# Patient Record
Sex: Male | Born: 1955 | Race: White | Hispanic: No | Marital: Married | Smoking: Former smoker
Health system: Southern US, Community
[De-identification: ages and names within clinical notes are randomized; demographics above are authoritative.]

---

## 1988-07-30 HISTORY — PX: KNEE SURGERY: SHX244

## 2003-07-31 HISTORY — PX: FINGER SURGERY: SHX640

## 2015-03-09 DIAGNOSIS — H532 Diplopia: Secondary | ICD-10-CM | POA: Insufficient documentation

## 2015-03-09 DIAGNOSIS — E669 Obesity, unspecified: Secondary | ICD-10-CM | POA: Insufficient documentation

## 2015-05-29 ENCOUNTER — Other Ambulatory Visit: Payer: Self-pay | Admitting: Family Medicine

## 2015-08-25 ENCOUNTER — Other Ambulatory Visit: Payer: Self-pay | Admitting: Family Medicine

## 2015-08-25 DIAGNOSIS — I10 Essential (primary) hypertension: Secondary | ICD-10-CM

## 2015-08-25 DIAGNOSIS — E7800 Pure hypercholesterolemia, unspecified: Secondary | ICD-10-CM

## 2015-08-25 NOTE — Telephone Encounter (Signed)
Last ov 10/11/14

## 2015-08-25 NOTE — Telephone Encounter (Signed)
Refilled medication. Remind patient he is due for physical and recheck of labs.

## 2015-09-30 ENCOUNTER — Encounter: Payer: Self-pay | Admitting: Family Medicine

## 2015-09-30 ENCOUNTER — Ambulatory Visit (INDEPENDENT_AMBULATORY_CARE_PROVIDER_SITE_OTHER): Payer: BLUE CROSS/BLUE SHIELD | Admitting: Family Medicine

## 2015-09-30 VITALS — BP 138/80 | HR 76 | Temp 97.6°F | Resp 16 | Wt 220.0 lb

## 2015-09-30 DIAGNOSIS — E7800 Pure hypercholesterolemia, unspecified: Secondary | ICD-10-CM

## 2015-09-30 DIAGNOSIS — I10 Essential (primary) hypertension: Secondary | ICD-10-CM

## 2015-09-30 MED ORDER — QUINAPRIL-HYDROCHLOROTHIAZIDE 20-12.5 MG PO TABS: 1.0000 | ORAL_TABLET | Freq: Every day | ORAL | Status: DC

## 2015-09-30 MED ORDER — AMLODIPINE BESYLATE 5 MG PO TABS: 5.0000 mg | ORAL_TABLET | Freq: Every day | ORAL | Status: DC

## 2015-09-30 MED ORDER — PRAVASTATIN SODIUM 40 MG PO TABS: 40.0000 mg | ORAL_TABLET | Freq: Every day | ORAL | Status: DC

## 2015-09-30 NOTE — Progress Notes (Signed)
Patient ID: Bradley Chambers, male   DOB: [birth year], 60 y.o.   MRN: [id number]       Patient: Bradley Chambers Male    DOB: 1956-02-15   60 y.o.   MRN: [id number] Visit Date: 09/30/2015  Today's Provider: Dortha Kern, PA   Chief Complaint  Patient presents with  . Hypertension  . Hyperlipidemia   Subjective:    HPI  Hypertension, follow-up:  BP Readings from Last 3 Encounters:  09/30/15 138/80  10/11/14 158/76    He was last seen for hypertension 1 years ago.  BP at that visit was 158/76. Management since that visit includes none. He reports fair compliance with treatment. He is not having side effects.  He is not exercising. He is adherent to low salt diet.   Outside blood pressures are not being checked. He is experiencing none.  Patient denies chest pain, chest pressure/discomfort, claudication, dyspnea, exertional chest pressure/discomfort, fatigue, irregular heart beat, lower extremity edema, near-syncope, orthopnea and palpitations.   Cardiovascular risk factors include advanced age (older than 56 for men, 71 for women), dyslipidemia, hypertension, male gender, obesity (BMI >= 30 kg/m2) and smoking/ tobacco exposure.  Wt Readings from Last 3 Encounters:  09/30/15 220 lb (99.791 kg)  10/11/14 212 lb (96.163 kg)   ------------------------------------------------------------------------    Lipid/Cholesterol, Follow-up:   Last seen for this1 years ago.  Management changes since that visit include none. . Last Lipid Panel: No results found for: CHOL, TRIG, HDL, CHOLHDL, VLDL, LDLCALC, LDLDIRECT  Risk factors for vascular disease include hypercholesterolemia and hypertension  He reports good compliance with treatment. He is not having side effects.  Current symptoms include none. Current exercise: none  Wt Readings from Last 3 Encounters:  09/30/15 220 lb (99.791 kg)  10/11/14 212 lb (96.163 kg)     -------------------------------------------------------------------      No Known Allergies Previous Medications   AMLODIPINE (NORVASC) 5 MG TABLET    TAKE 1 TABLET BY MOUTH DAILY   ASPIRIN 81 MG TABLET    1 tablet daily.   OMEGA-3 FATTY ACIDS PO    Take 1 capsule by mouth daily.   PRAVASTATIN (PRAVACHOL) 40 MG TABLET    TAKE 1 TABLET BY MOUTH AT BEDTIME   QUINAPRIL-HYDROCHLOROTHIAZIDE (ACCURETIC) 20-12.5 MG TABLET    TAKE 1 TABLET BY MOUTH DAILY    Review of Systems  Constitutional: Negative.   HENT: Negative.   Eyes: Negative.   Respiratory: Negative.   Cardiovascular: Negative.   Gastrointestinal: Negative.   Endocrine: Negative.   Genitourinary: Negative.   Musculoskeletal: Negative.   Skin: Negative.   Allergic/Immunologic: Negative.   Neurological: Negative.   Hematological: Negative.   Psychiatric/Behavioral: Negative.     Social History  Substance Use Topics  . Smoking status: Former Games developer  . Smokeless tobacco: Not on file     Comment: QUIT IN 1994  . Alcohol Use: 0.0 oz/week    0 Standard drinks or equivalent per week     Comment: OCCASIONALLY   Objective:   BP 138/80 mmHg  Pulse 76  Temp(Src) 97.6 F (36.4 C) (Oral)  Resp 16  Wt 220 lb (99.791 kg)   Physical Exam  Constitutional: He is oriented to person, place, and time. He appears well-developed and well-nourished.  HENT:  Head: Normocephalic and atraumatic.  Right Ear: External ear normal.  Left Ear: External ear normal.  Nose: Nose normal.  Mouth/Throat: Oropharynx is clear and moist.  Teeth in poor repair.  Eyes: Conjunctivae and EOM are  normal. Pupils are equal, round, and reactive to light. Right eye exhibits no discharge.  Neck: Normal range of motion. Neck supple. No tracheal deviation present. No thyromegaly present.  Cardiovascular: Normal rate, regular rhythm, normal heart sounds and intact distal pulses.   No murmur heard. Pulmonary/Chest: Effort normal and breath sounds  normal. No respiratory distress. He has no wheezes. He has no rales. He exhibits no tenderness.  Abdominal: Soft. He exhibits no distension and no mass. There is no tenderness. There is no rebound and no guarding.  Musculoskeletal: Normal range of motion. He exhibits no edema or tenderness.  Lymphadenopathy:    He has no cervical adenopathy.  Neurological: He is alert and oriented to person, place, and time. He has normal reflexes. No cranial nerve deficit. He exhibits normal muscle tone. Coordination normal.  Skin: Skin is warm and dry. No rash noted. No erythema.  Psychiatric: He has a normal mood and affect. His behavior is normal. Judgment and thought content normal.      Assessment & Plan:     1. Hypercholesterolemia without hypertriglyceridemia Feeling well and tolerating Pravastatin without myalgias or joint pains. Continues to work on diet. Has gained 8 lbs in the past year. Requests refill of medication. Is out of a job and insurance will be ending soon. Will get routine labs and given anticipatory guidance about upcoming immunizations and should consider scheduling CPE. Last Tdap was given in 2014 here. - Comprehensive metabolic panel - Lipid panel - TSH - pravastatin (PRAVACHOL) 40 MG tablet; Take 1 tablet (40 mg total) by mouth at bedtime.  Dispense: 90 tablet; Refill: 3  2. Essential (primary) hypertension Very good control of BP with continuation of Norvasc and Accuretic daily. Will check follow up labs and refill medications. Recheck pending reports. - CBC with Differential/Platelet - Comprehensive metabolic panel - quinapril-hydrochlorothiazide (ACCURETIC) 20-12.5 MG tablet; Take 1 tablet by mouth daily.  Dispense: 90 tablet; Refill: 3 - amLODipine (NORVASC) 5 MG tablet; Take 1 tablet (5 mg total) by mouth daily.  Dispense: 90 tablet; Refill: 3       Dortha Kernennis Chrismon, PA  [facility]

## 2015-10-01 LAB — CBC WITH DIFFERENTIAL/PLATELET
Basophils Absolute: 0 10*3/uL (ref 0.0–0.2)
Basos: 0 %
EOS (ABSOLUTE): 0.1 10*3/uL (ref 0.0–0.4)
Eos: 1 %
HEMOGLOBIN: 16.1 g/dL (ref 12.6–17.7)
Hematocrit: 46.4 % (ref 37.5–51.0)
IMMATURE GRANS (ABS): 0 10*3/uL (ref 0.0–0.1)
Immature Granulocytes: 0 %
LYMPHS ABS: 1.3 10*3/uL (ref 0.7–3.1)
LYMPHS: 24 %
MCH: 31 pg (ref 26.6–33.0)
MCHC: 34.7 g/dL (ref 31.5–35.7)
MCV: 89 fL (ref 79–97)
MONOCYTES: 11 %
Monocytes Absolute: 0.6 10*3/uL (ref 0.1–0.9)
Neutrophils Absolute: 3.5 10*3/uL (ref 1.4–7.0)
Neutrophils: 64 %
Platelets: 191 10*3/uL (ref 150–379)
RBC: 5.2 x10E6/uL (ref 4.14–5.80)
RDW: 14 % (ref 12.3–15.4)
WBC: 5.5 10*3/uL (ref 3.4–10.8)

## 2015-10-01 LAB — LIPID PANEL
Chol/HDL Ratio: 4 ratio units (ref 0.0–5.0)
Cholesterol, Total: 190 mg/dL (ref 100–199)
HDL: 48 mg/dL (ref >39)
LDL Calculated: 123 mg/dL — ABNORMAL HIGH (ref 0–99)
Triglycerides: 94 mg/dL (ref 0–149)
VLDL Cholesterol Cal: 19 mg/dL (ref 5–40)

## 2015-10-01 LAB — COMPREHENSIVE METABOLIC PANEL
A/G RATIO: 1.5 (ref 1.1–2.5)
ALBUMIN: 4.4 g/dL (ref 3.6–4.8)
ALT: 74 IU/L — AB (ref 0–44)
AST: 37 IU/L (ref 0–40)
Alkaline Phosphatase: 99 IU/L (ref 39–117)
BUN/Creatinine Ratio: 12 (ref 10–22)
BUN: 11 mg/dL (ref 8–27)
Bilirubin Total: 0.5 mg/dL (ref 0.0–1.2)
CALCIUM: 9.7 mg/dL (ref 8.6–10.2)
CO2: 26 mmol/L (ref 18–29)
Chloride: 98 mmol/L (ref 96–106)
Creatinine, Ser: 0.89 mg/dL (ref 0.76–1.27)
GFR, EST AFRICAN AMERICAN: 107 mL/min/{1.73_m2} (ref >59)
GFR, EST NON AFRICAN AMERICAN: 93 mL/min/{1.73_m2} (ref >59)
Globulin, Total: 3 g/dL (ref 1.5–4.5)
Glucose: 113 mg/dL — ABNORMAL HIGH (ref 65–99)
POTASSIUM: 4.5 mmol/L (ref 3.5–5.2)
Sodium: 138 mmol/L (ref 134–144)
TOTAL PROTEIN: 7.4 g/dL (ref 6.0–8.5)

## 2015-10-01 LAB — TSH: TSH: 1.31 u[IU]/mL (ref 0.450–4.500)

## 2015-10-03 ENCOUNTER — Telehealth: Payer: Self-pay

## 2015-10-03 NOTE — Telephone Encounter (Signed)
Patient advised as directed below. Patient verbalized understanding. Patient states he will call back to schedule a follow up appointment.  

## 2015-10-03 NOTE — Telephone Encounter (Signed)
-----   Message from Tamsen Roersennis E Chrismon, [place] sent at 10/01/2015 10:49 PM EST ----- Blood tests normal except blood sugar slightly up, LDL cholesterol high and one liver enzyme high. Recommend he get on low fat diet, lose some weight, decrease any alcohol intake and continue Pravastatin. Recheck progress in 3 months.

## 2016-09-19 ENCOUNTER — Other Ambulatory Visit: Payer: Self-pay | Admitting: Family Medicine

## 2016-09-19 DIAGNOSIS — I10 Essential (primary) hypertension: Secondary | ICD-10-CM

## 2016-11-24 ENCOUNTER — Other Ambulatory Visit: Payer: Self-pay | Admitting: Family Medicine

## 2016-11-24 DIAGNOSIS — I10 Essential (primary) hypertension: Secondary | ICD-10-CM

## 2016-12-16 ENCOUNTER — Other Ambulatory Visit: Payer: Self-pay | Admitting: Family Medicine

## 2016-12-16 DIAGNOSIS — I10 Essential (primary) hypertension: Secondary | ICD-10-CM

## 2016-12-17 NOTE — Telephone Encounter (Signed)
This is Bradley Chambers's pt. I called pt and scheduled FU for 12/28/2016. Allene DillonEmily Drozdowski, CMA

## 2016-12-28 ENCOUNTER — Ambulatory Visit: Payer: Self-pay | Admitting: Family Medicine

## 2017-01-03 ENCOUNTER — Encounter: Payer: Self-pay | Admitting: Family Medicine

## 2017-01-03 ENCOUNTER — Ambulatory Visit (INDEPENDENT_AMBULATORY_CARE_PROVIDER_SITE_OTHER): Payer: 59 | Admitting: Family Medicine

## 2017-01-03 VITALS — BP 150/84 | HR 71 | Temp 98.1°F | Ht 64.5 in | Wt 217.0 lb

## 2017-01-03 DIAGNOSIS — Z114 Encounter for screening for human immunodeficiency virus [HIV]: Secondary | ICD-10-CM | POA: Diagnosis not present

## 2017-01-03 DIAGNOSIS — Z1159 Encounter for screening for other viral diseases: Secondary | ICD-10-CM

## 2017-01-03 DIAGNOSIS — E7800 Pure hypercholesterolemia, unspecified: Secondary | ICD-10-CM

## 2017-01-03 DIAGNOSIS — I10 Essential (primary) hypertension: Secondary | ICD-10-CM

## 2017-01-03 MED ORDER — QUINAPRIL-HYDROCHLOROTHIAZIDE 20-12.5 MG PO TABS: 1.0000 | ORAL_TABLET | Freq: Every day | ORAL | 3 refills | Status: DC

## 2017-01-03 MED ORDER — PRAVASTATIN SODIUM 40 MG PO TABS: 40.0000 mg | ORAL_TABLET | Freq: Every day | ORAL | 3 refills | Status: DC

## 2017-01-03 NOTE — Progress Notes (Signed)
Patient: Bradley Chambers Male    DOB: 05/17/1956   61 y.o.   MRN: [id number] Visit Date: 01/03/2017  Today's Provider: Dortha Kern, PA   Chief Complaint  Patient presents with  . Hyperlipidemia  . Hypertension  . Follow-up   Subjective:    HPI  Hypertension, follow-up:  BP Readings from Last 3 Encounters:  01/03/17 (!) 150/84  09/30/15 138/80  10/11/14 (!) 158/76    He was last seen for hypertension 1 years ago.  BP at that visit was 138/80. Management since that visit includes continue medication. Recommended a low fat diet, lose weight and  decrease alcohol intake. Marland KitchenHe reports fair compliance with treatment. He is not having side effects.  He is not exercising. He is adherent to low salt diet.   Outside blood pressures are not being checked. He is experiencing none.  Patient denies chest pain, chest pressure/discomfort, irregular heart beat and palpitations.   Cardiovascular risk factors include advanced age (older than 66 for men, 65 for women), dyslipidemia, hypertension, male gender and obesity (BMI >= 30 kg/m2).  Use of agents associated with hypertension: none.   ------------------------------------------------------------------------    Lipid/Cholesterol, Follow-up:   Last seen for this 1 years ago.  Management since that visit includes continue medication. Recommended a low fat diet, lose weight and  decrease alcohol intake.  Last Lipid Panel:    Component Value Date/Time   CHOL 190 09/30/2015 1038   TRIG 94 09/30/2015 1038   HDL 48 09/30/2015 1038   CHOLHDL 4.0 09/30/2015 1038   LDLCALC 123 (H) 09/30/2015 1038    He reports fair compliance with treatment. He is not having side effects.   Wt Readings from Last 3 Encounters:  01/03/17 217 lb (98.4 kg)  09/30/15 220 lb (99.8 kg)  10/11/14 212 lb (96.2 kg)    ------------------------------------------------------------------------ Patient Active Problem List   Diagnosis Date Noted  . 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 Adiposity 03/09/2015  . Hypercholesterolemia without hypertriglyceridemia 05/05/2009  . Essential (primary) hypertension 06/18/2006  . Former smoker 06/18/2006   Past Surgical History:  Procedure Laterality Date  . FINGER SURGERY  2005  . KNEE SURGERY  1990   Family History  Problem Relation Age of Onset  . Heart disease Mother   . Cancer Father   . Hypertension Father   . Healthy Sister   . Healthy Brother    No Known Allergies   Previous Medications   AMLODIPINE (NORVASC) 5 MG TABLET    TAKE 1 TABLET (5 MG TOTAL) BY MOUTH DAILY.   ASPIRIN 81 MG TABLET    1 tablet daily.   OMEGA-3 FATTY ACIDS PO    Take 1 capsule by mouth daily.   PRAVASTATIN (PRAVACHOL) 40 MG TABLET    Take 1 tablet (40 mg total) by mouth at bedtime.   QUINAPRIL-HYDROCHLOROTHIAZIDE (ACCURETIC) 20-12.5 MG TABLET    TAKE 1 TABLET BY MOUTH EVERY DAY    Review of Systems  Constitutional: Negative.   Respiratory: Negative.   Cardiovascular: Negative.   Musculoskeletal: Negative.     Social History  Substance Use Topics  . Smoking status: Former Games developer  . Smokeless tobacco: Never Used     Comment: QUIT IN 1994  . Alcohol use 0.0 oz/week     Comment: OCCASIONALLY   Objective:   BP (!) 150/84 (BP Location: Right Arm, Patient Position: Sitting, Cuff Size: Large)   Pulse 71   Temp 98.1 F (36.7 C) (Oral)  Ht 5' 4.5" (1.638 m)   Wt 217 lb (98.4 kg)   SpO2 97%   BMI 36.67 kg/m   Physical Exam  Constitutional: He is oriented to person, place, and time. He appears well-developed and well-nourished.  HENT:  Head: Normocephalic.  Right Ear: External ear normal.  Left Ear: External ear normal.  Nose: Nose normal.  Mouth/Throat: Oropharynx is clear and moist.  Eyes: Conjunctivae are normal.  Neck: Neck supple. No thyromegaly present.  Cardiovascular: Normal rate and regular rhythm.   Pulmonary/Chest: Effort normal and breath sounds normal.  Abdominal: Soft.  Bowel sounds are normal.  Musculoskeletal: Normal range of motion.  Lymphadenopathy:    He has no cervical adenopathy.  Neurological: He is alert and oriented to person, place, and time.  Psychiatric: He has a normal mood and affect. His behavior is normal. Thought content normal.      Assessment & Plan:     1. Essential (primary) hypertension Slightly elevated systolic pressure. Unsure if he has been taking both the Amlodipine and Accuretic regularly. Restrict sodium intake and take both med regularly. Recheck CBC, CMP and TSH. Follow up pending reports. - CBC with Differential/Platelet - Comprehensive metabolic panel - TSH - quinapril-hydrochlorothiazide (ACCURETIC) 20-12.5 MG tablet; Take 1 tablet by mouth daily.  Dispense: 90 tablet; Refill: 3  2. 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 Trying to restrict fats in diet. Been off the Pravastatin for a couple months. Refill Pravastatin and recheck labs. Follow up pending reports. - CBC with Differential/Platelet - Comprehensive metabolic panel - Lipid panel - TSH - pravastatin (PRAVACHOL) 40 MG tablet; Take 1 tablet (40 mg total) by mouth at bedtime.  Dispense: 90 tablet; Refill: 3  3. Screening for HIV (human immunodeficiency virus) - HIV antibody  4. Need for hepatitis C screening test - Hepatitis C Antibody

## 2017-01-04 ENCOUNTER — Telehealth: Payer: Self-pay

## 2017-01-04 LAB — CBC WITH DIFFERENTIAL/PLATELET
BASOS: 1 %
Basophils Absolute: 0 10*3/uL (ref 0.0–0.2)
EOS (ABSOLUTE): 0.1 10*3/uL (ref 0.0–0.4)
EOS: 1 %
HEMATOCRIT: 46.6 % (ref 37.5–51.0)
HEMOGLOBIN: 16.4 g/dL (ref 13.0–17.7)
IMMATURE GRANS (ABS): 0 10*3/uL (ref 0.0–0.1)
Immature Granulocytes: 0 %
LYMPHS: 30 %
Lymphocytes Absolute: 1.5 10*3/uL (ref 0.7–3.1)
MCH: 32.2 pg (ref 26.6–33.0)
MCHC: 35.2 g/dL (ref 31.5–35.7)
MCV: 91 fL (ref 79–97)
MONOCYTES: 11 %
Monocytes Absolute: 0.5 10*3/uL (ref 0.1–0.9)
NEUTROS ABS: 2.8 10*3/uL (ref 1.4–7.0)
NEUTROS PCT: 57 %
PLATELETS: 184 10*3/uL (ref 150–379)
RBC: 5.1 x10E6/uL (ref 4.14–5.80)
RDW: 14 % (ref 12.3–15.4)
WBC: 4.9 10*3/uL (ref 3.4–10.8)

## 2017-01-04 LAB — COMPREHENSIVE METABOLIC PANEL
A/G RATIO: 1.5 (ref 1.2–2.2)
ALBUMIN: 4.3 g/dL (ref 3.6–4.8)
ALT: 80 IU/L — AB (ref 0–44)
AST: 36 IU/L (ref 0–40)
Alkaline Phosphatase: 85 IU/L (ref 39–117)
BILIRUBIN TOTAL: 0.5 mg/dL (ref 0.0–1.2)
BUN / CREAT RATIO: 10 (ref 10–24)
BUN: 11 mg/dL (ref 8–27)
CALCIUM: 9.5 mg/dL (ref 8.6–10.2)
CO2: 27 mmol/L (ref 18–29)
Chloride: 98 mmol/L (ref 96–106)
Creatinine, Ser: 1.05 mg/dL (ref 0.76–1.27)
GFR calc non Af Amer: 76 mL/min/{1.73_m2} (ref >59)
GFR, EST AFRICAN AMERICAN: 88 mL/min/{1.73_m2} (ref >59)
Globulin, Total: 2.9 g/dL (ref 1.5–4.5)
Glucose: 110 mg/dL — ABNORMAL HIGH (ref 65–99)
POTASSIUM: 4.4 mmol/L (ref 3.5–5.2)
Sodium: 137 mmol/L (ref 134–144)
TOTAL PROTEIN: 7.2 g/dL (ref 6.0–8.5)

## 2017-01-04 LAB — TSH: TSH: 1.61 u[IU]/mL (ref 0.450–4.500)

## 2017-01-04 LAB — HEPATITIS C ANTIBODY: Hep C Virus Ab: <0.1 s/co ratio (ref 0.0–0.9)

## 2017-01-04 LAB — LIPID PANEL
Chol/HDL Ratio: 5.7 ratio — ABNORMAL HIGH (ref 0.0–5.0)
Cholesterol, Total: 233 mg/dL — ABNORMAL HIGH (ref 100–199)
HDL: 41 mg/dL (ref >39)
LDL Calculated: 171 mg/dL — ABNORMAL HIGH (ref 0–99)
Triglycerides: 107 mg/dL (ref 0–149)
VLDL CHOLESTEROL CAL: 21 mg/dL (ref 5–40)

## 2017-01-04 LAB — HIV ANTIBODY (ROUTINE TESTING W REFLEX): HIV Screen 4th Generation wRfx: NONREACTIVE

## 2017-01-04 NOTE — Telephone Encounter (Signed)
Patient advised. He will call back to schedule follow up appointment.  

## 2017-01-04 NOTE — Telephone Encounter (Signed)
-----   Message from Tamsen Roersennis E Chrismon, [place] sent at 01/04/2017  8:25 AM EDT ----- Total cholesterol and LDL higher with slight elevation of glucose and ALT (liver enzyme). Getting back on medications regularly, following low fat diet with ETOH restriction and drinking plenty of water should get these things back in line. Recheck appointment in 3 months to check progress.

## 2017-03-15 ENCOUNTER — Other Ambulatory Visit: Payer: Self-pay | Admitting: Family Medicine

## 2017-03-15 DIAGNOSIS — I10 Essential (primary) hypertension: Secondary | ICD-10-CM

## 2017-03-17 NOTE — Telephone Encounter (Signed)
Check with [facility] [address] - this prescriptions shows confirmation from the pharmacy of refills for a year on 01-03-17.

## 2017-03-19 NOTE — Telephone Encounter (Signed)
I called pharmacy and pharmacist states that they did not receive prescription that was sent in on 01/03/2017. I then called prescription into pharmacy.Marland Kitchen

## 2017-04-30 ENCOUNTER — Telehealth: Payer: Self-pay

## 2017-04-30 NOTE — Telephone Encounter (Signed)
Acknowledged. Thanks

## 2017-04-30 NOTE — Telephone Encounter (Addendum)
Patient advised. He states he has not been taking the HCTZ because he read the warning that stated don't take medication if out in direct sunlight. Patient states he is in direct sunlight 10-12 hours per day so he did not start medication. Attempted to schedule a follow up appointment. Patient states he will call back to schedule follow up later because Maurine Minister advised him to follow up in 1 year.

## 2017-04-30 NOTE — Telephone Encounter (Signed)
Left message for patient to return call. Per Maurine Minister patient needs to take HCTZ RX to the pharmacy or call and see if it was included in the recall.   Patient also needs to schedule his 3 month follow up appointment to check BP and labs.

## 2017-11-20 ENCOUNTER — Other Ambulatory Visit: Payer: Self-pay | Admitting: Family Medicine

## 2017-11-20 DIAGNOSIS — I10 Essential (primary) hypertension: Secondary | ICD-10-CM

## 2017-12-30 ENCOUNTER — Other Ambulatory Visit: Payer: Self-pay | Admitting: Family Medicine

## 2017-12-30 DIAGNOSIS — E7800 Pure hypercholesterolemia, unspecified: Secondary | ICD-10-CM

## 2018-02-13 ENCOUNTER — Other Ambulatory Visit: Payer: Self-pay | Admitting: Family Medicine

## 2018-02-13 DIAGNOSIS — I10 Essential (primary) hypertension: Secondary | ICD-10-CM

## 2018-02-17 ENCOUNTER — Other Ambulatory Visit: Payer: Self-pay | Admitting: Family Medicine

## 2018-02-18 NOTE — Telephone Encounter (Signed)
Hasn't seen Bradley Chambers in > 1 year. Have refilled for 30 days, he will need to follow up.

## 2018-02-18 NOTE — Telephone Encounter (Signed)
Left patient a message advising him that RX has been sent in for a 1 month supply and call the office to schedule a follow up appointment within the next month for further refills.

## 2018-03-07 ENCOUNTER — Ambulatory Visit: Payer: 59 | Admitting: Family Medicine

## 2018-03-18 ENCOUNTER — Other Ambulatory Visit: Payer: Self-pay | Admitting: Physician Assistant

## 2018-03-18 NOTE — Telephone Encounter (Signed)
Please review for Dennis.    Thanks,   -Laura  

## 2018-03-25 ENCOUNTER — Other Ambulatory Visit: Payer: Self-pay | Admitting: Family Medicine

## 2018-03-25 DIAGNOSIS — E7800 Pure hypercholesterolemia, unspecified: Secondary | ICD-10-CM

## 2018-04-04 ENCOUNTER — Ambulatory Visit: Payer: 59 | Admitting: Family Medicine

## 2018-04-04 ENCOUNTER — Encounter: Payer: Self-pay | Admitting: Family Medicine

## 2018-04-04 VITALS — BP 154/76 | HR 79 | Temp 98.2°F | Wt 216.0 lb

## 2018-04-04 DIAGNOSIS — I10 Essential (primary) hypertension: Secondary | ICD-10-CM | POA: Diagnosis not present

## 2018-04-04 DIAGNOSIS — Z8639 Personal history of other endocrine, nutritional and metabolic disease: Secondary | ICD-10-CM

## 2018-04-04 DIAGNOSIS — E7800 Pure hypercholesterolemia, unspecified: Secondary | ICD-10-CM | POA: Diagnosis not present

## 2018-04-04 MED ORDER — PRAVASTATIN SODIUM 40 MG PO TABS: 40.0000 mg | ORAL_TABLET | Freq: Every day | ORAL | 3 refills | Status: DC

## 2018-04-04 NOTE — Progress Notes (Signed)
Patient: Bradley Chambers Male    DOB: 1956-01-12   62 y.o.   MRN: [id number] Visit Date: 04/04/2018  Today's Provider: Dortha Kern, PA   Chief Complaint  Patient presents with  . Hypertension  . Hyperlipidemia   Subjective:    Hypertension  This is a chronic problem. The problem is unchanged. The problem is controlled. Pertinent negatives include no anxiety, blurred vision, chest pain, headaches, malaise/fatigue, neck pain, orthopnea, palpitations, peripheral edema, PND, shortness of breath or sweats. There are no associated agents to hypertension. There are no compliance problems.   Hyperlipidemia  This is a chronic problem. The problem is uncontrolled. Recent lipid tests were reviewed and are high. Pertinent negatives include no chest pain or shortness of breath. Current antihyperlipidemic treatment includes statins. There are no compliance problems.    BP Readings from Last 3 Encounters:  04/04/18 (!) 154/76  01/03/17 (!) 150/84  09/30/15 138/80   Lab Results  Component Value Date   CHOL 233 (H) 01/03/2017   CHOL 190 09/30/2015   Lab Results  Component Value Date   HDL 41 01/03/2017   HDL 48 09/30/2015   Lab Results  Component Value Date   LDLCALC 171 (H) 01/03/2017   LDLCALC 123 (H) 09/30/2015   Lab Results  Component Value Date   TRIG 107 01/03/2017   TRIG 94 09/30/2015   Lab Results  Component Value Date   CHOLHDL 5.7 (H) 01/03/2017   CHOLHDL 4.0 09/30/2015   No results found for: LDLDIRECT Wt Readings from Last 3 Encounters:  04/04/18 216 lb (98 kg)  01/03/17 217 lb (98.4 kg)  09/30/15 220 lb (99.8 kg)   Patient Active Problem List   Diagnosis Date Noted  . Double vision with both eyes open 03/09/2015  . Adiposity 03/09/2015  . Hypercholesterolemia without hypertriglyceridemia 05/05/2009  . Essential (primary) hypertension 06/18/2006  . Former smoker 06/18/2006   Past Surgical History:  Procedure Laterality Date  . FINGER SURGERY  2005    . KNEE SURGERY  1990   Family History  Problem Relation Age of Onset  . Heart disease Mother   . Cancer Father   . Hypertension Father   . Healthy Sister   . Healthy Brother    No Known Allergies  Current Outpatient Medications:  .  amLODipine (NORVASC) 5 MG tablet, TAKE 1 TABLET BY MOUTH EVERY DAY, Disp: 90 tablet, Rfl: 0 .  aspirin 81 MG tablet, 1 tablet daily., Disp: , Rfl:  .  OMEGA-3 FATTY ACIDS PO, Take 1 capsule by mouth daily., Disp: , Rfl:  .  pravastatin (PRAVACHOL) 40 MG tablet, TAKE 1 TABLET BY MOUTH EVERYDAY AT BEDTIME, Disp: 30 tablet, Rfl: 0 .  quinapril (ACCUPRIL) 20 MG tablet, TAKE 1 TABLET BY MOUTH EVERY DAY, Disp: 30 tablet, Rfl: 1 .  quinapril-hydrochlorothiazide (ACCURETIC) 20-12.5 MG tablet, Take 1 tablet by mouth daily., Disp: 90 tablet, Rfl: 3  Review of Systems  Constitutional: Negative.  Negative for malaise/fatigue.  Eyes: Negative for blurred vision.  Respiratory: Negative.  Negative for shortness of breath.   Cardiovascular: Negative.  Negative for chest pain, palpitations, orthopnea and PND.  Gastrointestinal: Negative.   Musculoskeletal: Negative.  Negative for neck pain.  Neurological: Negative for dizziness, light-headedness and headaches.   Social History   Tobacco Use  . Smoking status: Former Games developer  . Smokeless tobacco: Never Used  . Tobacco comment: QUIT IN 1994  Substance Use Topics  . Alcohol use: Yes  Alcohol/week: 0.0 standard drinks    Comment: OCCASIONALLY   Objective:   BP (!) 154/76 (BP Location: Right Arm, Patient Position: Sitting, Cuff Size: Large)   Pulse 79   Temp 98.2 F (36.8 C)   Wt 216 lb (98 kg)   SpO2 95%   BMI 36.50 kg/m  Vitals:   04/04/18 0807  BP: (!) 154/76  Pulse: 79  Temp: 98.2 F (36.8 C)  SpO2: 95%  Weight: 216 lb (98 kg)   Physical Exam  Constitutional: He is oriented to person, place, and time. He appears well-developed and well-nourished. No distress.  HENT:  Head: Normocephalic and  atraumatic.  Right Ear: Hearing normal.  Left Ear: Hearing normal.  Nose: Nose normal.  Eyes: Conjunctivae and lids are normal. Right eye exhibits no discharge. Left eye exhibits no discharge. No scleral icterus.  Neck: Neck supple.  Cardiovascular: Normal rate, regular rhythm and normal heart sounds.  Pulmonary/Chest: Effort normal and breath sounds normal. No respiratory distress.  Abdominal: Soft. Bowel sounds are normal.  Musculoskeletal: Normal range of motion.  Neurological: He is alert and oriented to person, place, and time.  Skin: Skin is intact. No lesion and no rash noted.  Psychiatric: He has a normal mood and affect. His speech is normal and behavior is normal. Thought content normal.      Assessment & Plan:     1. Essential (primary) hypertension Some elevation of systolic pressure. Continues to take Amlodipine 5 mg qd and Accupril 20 mg qd. Refused to take the HCTZ because the PI sheet indicated some possible sensitivity to sun exposure. Will check routine labs and consider increase in Amlodipine to 10 mg qd. Recheck pending reports. - CBC with Differential/Platelet - TSH - Comprehensive metabolic panel  2. Hypercholesterolemia without hypertriglyceridemia Tolerating Pravastatin without side effects. Continue to work on weight loss and follow low fat diet. Refill Pravastatin 40 mg qd and recheck labs. - pravastatin (PRAVACHOL) 40 MG tablet; Take 1 tablet (40 mg total) by mouth at bedtime.  Dispense: 90 tablet; Refill: 3 - TSH - Comprehensive metabolic panel - Lipid panel  3. History of elevated glucose Had elevated blood sugar a year ago. Weight stable but BMI over 36. Will check TSH, CMP and Hgb A1C to rule out metabolic disorder. - TSH - Comprehensive metabolic panel - Hemoglobin A1c       Dortha Kern, PA  [facility]

## 2018-04-05 LAB — CBC WITH DIFFERENTIAL/PLATELET
BASOS: 1 %
Basophils Absolute: 0 10*3/uL (ref 0.0–0.2)
EOS (ABSOLUTE): 0.1 10*3/uL (ref 0.0–0.4)
Eos: 2 %
HEMOGLOBIN: 15.3 g/dL (ref 13.0–17.7)
Hematocrit: 45.7 % (ref 37.5–51.0)
Immature Grans (Abs): 0 10*3/uL (ref 0.0–0.1)
Immature Granulocytes: 0 %
LYMPHS: 28 %
Lymphocytes Absolute: 1.3 10*3/uL (ref 0.7–3.1)
MCH: 28.9 pg (ref 26.6–33.0)
MCHC: 33.5 g/dL (ref 31.5–35.7)
MCV: 86 fL (ref 79–97)
MONOS ABS: 0.5 10*3/uL (ref 0.1–0.9)
Monocytes: 10 %
Neutrophils Absolute: 2.7 10*3/uL (ref 1.4–7.0)
Neutrophils: 59 %
Platelets: 198 10*3/uL (ref 150–450)
RBC: 5.3 x10E6/uL (ref 4.14–5.80)
RDW: 13.2 % (ref 12.3–15.4)
WBC: 4.7 10*3/uL (ref 3.4–10.8)

## 2018-04-05 LAB — COMPREHENSIVE METABOLIC PANEL
ALK PHOS: 84 IU/L (ref 39–117)
ALT: 55 IU/L — AB (ref 0–44)
AST: 34 IU/L (ref 0–40)
Albumin/Globulin Ratio: 1.7 (ref 1.2–2.2)
Albumin: 4.5 g/dL (ref 3.6–4.8)
BILIRUBIN TOTAL: 0.5 mg/dL (ref 0.0–1.2)
BUN/Creatinine Ratio: 13 (ref 10–24)
BUN: 13 mg/dL (ref 8–27)
CHLORIDE: 102 mmol/L (ref 96–106)
CO2: 24 mmol/L (ref 20–29)
CREATININE: 1 mg/dL (ref 0.76–1.27)
Calcium: 9.5 mg/dL (ref 8.6–10.2)
GFR calc Af Amer: 93 mL/min/{1.73_m2} (ref >59)
GFR calc non Af Amer: 80 mL/min/{1.73_m2} (ref >59)
Globulin, Total: 2.6 g/dL (ref 1.5–4.5)
Glucose: 104 mg/dL — ABNORMAL HIGH (ref 65–99)
Potassium: 4.4 mmol/L (ref 3.5–5.2)
Sodium: 139 mmol/L (ref 134–144)
Total Protein: 7.1 g/dL (ref 6.0–8.5)

## 2018-04-05 LAB — LIPID PANEL
CHOLESTEROL TOTAL: 178 mg/dL (ref 100–199)
Chol/HDL Ratio: 4.5 ratio (ref 0.0–5.0)
HDL: 40 mg/dL (ref >39)
LDL Calculated: 117 mg/dL — ABNORMAL HIGH (ref 0–99)
TRIGLYCERIDES: 104 mg/dL (ref 0–149)
VLDL Cholesterol Cal: 21 mg/dL (ref 5–40)

## 2018-04-05 LAB — TSH: TSH: 1.64 u[IU]/mL (ref 0.450–4.500)

## 2018-04-05 LAB — HEMOGLOBIN A1C
Est. average glucose Bld gHb Est-mCnc: 117 mg/dL
Hgb A1c MFr Bld: 5.7 % — ABNORMAL HIGH (ref 4.8–5.6)

## 2018-04-08 ENCOUNTER — Other Ambulatory Visit: Payer: Self-pay | Admitting: Family Medicine

## 2018-04-08 DIAGNOSIS — I10 Essential (primary) hypertension: Secondary | ICD-10-CM

## 2018-04-08 MED ORDER — AMLODIPINE BESYLATE 10 MG PO TABS: 10.0000 mg | ORAL_TABLET | Freq: Every day | ORAL | 3 refills | Status: DC

## 2018-04-08 MED ORDER — QUINAPRIL HCL 20 MG PO TABS: 20.0000 mg | ORAL_TABLET | Freq: Every day | ORAL | 3 refills | Status: DC

## 2018-04-10 ENCOUNTER — Telehealth: Payer: Self-pay

## 2018-04-10 NOTE — Telephone Encounter (Signed)
-----   Message from Tamsen Roersennis E Chrismon, [place] sent at 04/08/2018  1:47 PM EDT ----- Very good hgb A1C, cholesterol, kidney function and blood sugar. Normal thyroid function and no anemia. Continue Pravastatin 40 mg qd with work on losing some weight. Continue Quinapril 20 mg qd and increase Amlodipine to 10 mg qd ([facility] [address] [facility] Recheck BP in 3 months.

## 2018-04-10 NOTE — Telephone Encounter (Signed)
Pt advised.  He is going to call back and schedule a follow up visit another time.  Thanks,   -Vernona RiegerLaura

## 2018-05-20 ENCOUNTER — Other Ambulatory Visit: Payer: Self-pay | Admitting: Family Medicine

## 2018-05-20 DIAGNOSIS — I10 Essential (primary) hypertension: Secondary | ICD-10-CM

## 2018-05-20 NOTE — Telephone Encounter (Signed)
Will forward to PCP  Aaryn Parrilla, Marzella Schlein, MD, MPH [facility] 05/20/2018 8:15 AM

## 2019-01-15 ENCOUNTER — Other Ambulatory Visit: Payer: Self-pay | Admitting: Family Medicine

## 2019-01-15 DIAGNOSIS — E7800 Pure hypercholesterolemia, unspecified: Secondary | ICD-10-CM

## 2019-04-10 ENCOUNTER — Other Ambulatory Visit: Payer: Self-pay | Admitting: Family Medicine

## 2019-04-10 DIAGNOSIS — I10 Essential (primary) hypertension: Secondary | ICD-10-CM

## 2019-07-17 ENCOUNTER — Other Ambulatory Visit: Payer: Self-pay | Admitting: Family Medicine

## 2019-07-17 DIAGNOSIS — I10 Essential (primary) hypertension: Secondary | ICD-10-CM

## 2019-10-13 ENCOUNTER — Other Ambulatory Visit: Payer: Self-pay | Admitting: Physician Assistant

## 2019-10-13 DIAGNOSIS — I10 Essential (primary) hypertension: Secondary | ICD-10-CM

## 2019-10-13 NOTE — Telephone Encounter (Signed)
Requested medications are due for refill today?  Yes  Requested medications are on active medication list?  Yes  Last Refill:   both medications were filled on 07/20/2019 # 90 with no refills.    Future visit scheduled? No  Notes to Clinic:  Patient's last office visit was 04/04/2018.

## 2019-11-12 NOTE — Progress Notes (Signed)
Established patient visit      I,Bradley Chambers,acting as a scribe for [employer], PA.,have documented all relevant documentation on the behalf of [employer], PA,as directed by  [employer], PA while in the presence of Bradley Chambers, [place].  Patient: Bradley Chambers   DOB: 1956/07/18   64 y.o. Male  MRN: [id number] Visit Date: 11/13/2019  Today's healthcare provider: Vernie Murders, PA  Subjective:    Chief Complaint  Patient presents with  . Follow-up  . Hypertension  . Hyperlipidemia   HPI  Hypertension, follow-up  BP Readings from Last 3 Encounters:  11/13/19 (!) 166/85  04/04/18 (!) 154/76  01/03/17 (!) 150/84   He was last seen for hypertension 04/04/2018.  BP at that visit was 154/76. Management since that visit includes; labs checked. Refused to take the HCTZ because the PI sheet indicated some possible sensitivity to sun exposure. Will check routine labs and consider increase in Amlodipine to 10 mg qd. Advised to Continue Quinapril 20 mg qd and increase Amlodipine to 10 mg qd. He reports good compliance with treatment. He is not having side effects. none He is not exercising. He is adherent to low salt diet.   Outside blood pressures are not checking.  He does not smoke.  Use of agents associated with hypertension: NSAIDS.   --------------------------------------------------------------------  Lipid/Cholesterol, follow-up  Last Lipid Panel:    Component Value Date/Time   CHOL 178 04/04/2018 0900   TRIG 104 04/04/2018 0900   HDL 40 04/04/2018 0900   CHOLHDL 4.5 04/04/2018 0900   LDLCALC 117 (H) 04/04/2018 0900    He was last seen for this 04/04/2018.  Management since that visit includes;labs checked showing-cholesterol normal. He reports fair compliance with treatment. He is not having side effects. none He is following a Regular, Low Sodium diet. Current exercise: none  Wt Readings from Last 3 Encounters:  11/13/19 220 lb (99.8 kg)   04/04/18 216 lb (98 kg)  01/03/17 217 lb (98.4 kg)   Last metabolic panel Lab Results  Component Value Date   GLUCOSE 104 (H) 04/04/2018   NA 139 04/04/2018   K 4.4 04/04/2018   CL 102 04/04/2018   CO2 24 04/04/2018   BUN 13 04/04/2018   CREATININE 1.00 04/04/2018   GFRNONAA 80 04/04/2018   GFRAA 93 04/04/2018   CALCIUM 9.5 04/04/2018   PROT 7.1 04/04/2018   ALBUMIN 4.5 04/04/2018   LABGLOB 2.6 04/04/2018   AGRATIO 1.7 04/04/2018   BILITOT 0.5 04/04/2018   ALKPHOS 84 04/04/2018   AST 34 04/04/2018   ALT 55 (H) 04/04/2018   The 10-year ASCVD risk score Mikey Bussing DC Jr., et al., 2013) is: 21.9%  --------------------------------------------------------------------    History reviewed. No pertinent past medical history. Past Surgical History:  Procedure Laterality Date  . FINGER SURGERY  2005  . KNEE SURGERY  1990   Social History   Socioeconomic History  . Marital status: Married    Spouse name: Not on file  . Number of children: Not on file  . Years of education: Not on file  . Highest education level: Not on file  Occupational History  . Not on file  Tobacco Use  . Smoking status: Former Research scientist (life sciences)  . Smokeless tobacco: Never Used  . Tobacco comment: QUIT IN 1994  Substance and Sexual Activity  . Alcohol use: Yes    Alcohol/week: 0.0 standard drinks    Comment: OCCASIONALLY  . Drug use: No  . Sexual activity: Not on  file  Other Topics Concern  . Not on file  Social History Narrative  . Not on file   Social Determinants of Health   Financial Resource Strain:   . Difficulty of Paying Living Expenses:   Food Insecurity:   . Worried About Programme researcher, broadcasting/film/video in the Last Year:   . Barista in the Last Year:   Transportation Needs:   . Freight forwarder (Medical):   Marland Kitchen Lack of Transportation (Non-Medical):   Physical Activity:   . Days of Exercise per Week:   . Minutes of Exercise per Session:   Stress:   . Feeling of Stress :   Social  Connections:   . Frequency of Communication with Friends and Family:   . Frequency of Social Gatherings with Friends and Family:   . Attends Religious Services:   . Active Member of Clubs or Organizations:   . Attends Banker Meetings:   Marland Kitchen Marital Status:   Intimate Partner Violence:   . Fear of Current or Ex-Partner:   . Emotionally Abused:   Marland Kitchen Physically Abused:   . Sexually Abused:    Family History  Problem Relation Age of Onset  . Heart disease Mother   . Healthy Sister   . Healthy Brother   . Cancer Father   . Hypertension Father    No Known Allergies     Medications: Outpatient Medications Prior to Visit  Medication Sig  . amLODipine (NORVASC) 10 MG tablet TAKE 1 TABLET BY MOUTH EVERY DAY  . aspirin 81 MG tablet 1 tablet daily.  . OMEGA-3 FATTY ACIDS PO Take 1 capsule by mouth daily.  . pravastatin (PRAVACHOL) 40 MG tablet TAKE 1 TABLET BY MOUTH EVERYDAY AT BEDTIME  . quinapril (ACCUPRIL) 20 MG tablet TAKE 1 TABLET BY MOUTH EVERY DAY   No facility-administered medications prior to visit.    Review of Systems  Constitutional: Negative for appetite change, chills and fever.  Respiratory: Negative for chest tightness, shortness of breath and wheezing.   Cardiovascular: Negative for chest pain and palpitations.  Gastrointestinal: Negative for abdominal pain, nausea and vomiting.  Genitourinary: Negative.         Objective:    BP (!) 166/85 (BP Location: Right Arm, Patient Position: Sitting, Cuff Size: Large)   Pulse 87   Temp (!) 97.3 F (36.3 C) (Other (Comment))   Resp 16   Ht 5\' 5"  (1.651 m)   Wt 220 lb (99.8 kg)   SpO2 95%   BMI 36.61 kg/m    Physical Exam Vitals reviewed. Exam conducted with a chaperone present.  Constitutional:      Appearance: Normal appearance. He is obese.  HENT:     Right Ear: Tympanic membrane, ear canal and external ear normal.     Left Ear: Tympanic membrane, ear canal and external ear normal.     Nose:  Nose normal.     Mouth/Throat:     Mouth: Mucous membranes are dry.     Pharynx: Oropharynx is clear.  Cardiovascular:     Rate and Rhythm: Normal rate and regular rhythm.     Pulses: Normal pulses.     Heart sounds: Normal heart sounds. No murmur.  Pulmonary:     Effort: Pulmonary effort is normal.     Breath sounds: Normal breath sounds. No wheezing.  Abdominal:     Palpations: Abdomen is soft. There is no mass.  Musculoskeletal:        General:  Normal range of motion.     Right lower leg: No edema.     Left lower leg: No edema.  Skin:    General: Skin is warm and dry.  Neurological:     Mental Status: He is alert.  Psychiatric:        Mood and Affect: Mood normal.        Behavior: Behavior normal.     Depression screen [facility] 2/9 11/13/2019 11/13/2019 04/04/2018 01/03/2017  Decreased Interest 0 0 0 0  Down, Depressed, Hopeless 0 0 0 0  PHQ - 2 Score 0 0 0 0  Altered sleeping 0 0 0 0  Tired, decreased energy 0 0 0 0  Change in appetite 0 0 0 0  Feeling bad or failure about yourself  0 0 0 0  Trouble concentrating 0 0 0 0  Moving slowly or fidgety/restless 0 0 0 0  Suicidal thoughts 0 0 0 0  PHQ-9 Score 0 0 0 0  Difficult doing work/chores Not difficult at all Not difficult at all Not difficult at all Not difficult at all       Assessment & Plan:    1. Essential (primary) hypertension Tolerating the Amlodipine 10 mg qd and Quinapril 20 mg qd without side effects. No chest discomfort, palpitations, dyspnea or peripheral edema. Refill medications and recheck follow up labs. Schedule follow up appointment pending lab reports. - CBC w/Diff/Platelet - Comprehensive Metabolic Panel (CMET) - TSH - amLODipine (NORVASC) 10 MG tablet; Take 1 tablet (10 mg total) by mouth daily.  Dispense: 90 tablet; Refill: 3 - quinapril (ACCUPRIL) 20 MG tablet; Take 1 tablet (20 mg total) by mouth daily.  Dispense: 90 tablet; Refill: 3  2. Hypercholesterolemia without hypertriglyceridemia  Continues to take the Pravastatin 40 mg qd with Omega-3 Fish Oil qd. Needs to lose a little more weight and will recheck fasting labs. - CBC w/Diff/Platelet - Comprehensive Metabolic Panel (CMET) - TSH  3. History of elevated glucose No polyuria, polydipsia or significant vision changes. Weight up 4 lbs from last year. Some blame on the pandemic isolation and less activity of the winter months. Given cholesterol and DASH diet. Encouraged to get more physical activity. Recheck labs. - CBC w/Diff/Platelet - Comprehensive Metabolic Panel (CMET) - TSH  4. Screening for prostate cancer Denies nocturia, urinary frequency or dribbling. No significant decrease in stream. Refuses colonoscopy and immunizations. Will check PSA. - PSA      I, Dortha Kern, PA, have reviewed all documentation for this visit. The documentation on 11/13/19 for the exam, diagnosis, procedures, and orders are all accurate and complete.    Dortha Kern, PA  [facility] [phone] (phone) [phone] (fax)  [facility]

## 2019-11-13 ENCOUNTER — Encounter: Payer: Self-pay | Admitting: Family Medicine

## 2019-11-13 ENCOUNTER — Other Ambulatory Visit: Payer: Self-pay

## 2019-11-13 ENCOUNTER — Ambulatory Visit: Payer: 59 | Admitting: Family Medicine

## 2019-11-13 VITALS — BP 166/85 | HR 87 | Temp 97.3°F | Resp 16 | Ht 65.0 in | Wt 220.0 lb

## 2019-11-13 DIAGNOSIS — Z125 Encounter for screening for malignant neoplasm of prostate: Secondary | ICD-10-CM | POA: Diagnosis not present

## 2019-11-13 DIAGNOSIS — Z8639 Personal history of other endocrine, nutritional and metabolic disease: Secondary | ICD-10-CM | POA: Diagnosis not present

## 2019-11-13 DIAGNOSIS — E7800 Pure hypercholesterolemia, unspecified: Secondary | ICD-10-CM | POA: Diagnosis not present

## 2019-11-13 DIAGNOSIS — I10 Essential (primary) hypertension: Secondary | ICD-10-CM | POA: Diagnosis not present

## 2019-11-13 MED ORDER — QUINAPRIL HCL 20 MG PO TABS: 20.0000 mg | ORAL_TABLET | Freq: Every day | ORAL | 3 refills | Status: AC

## 2019-11-13 MED ORDER — AMLODIPINE BESYLATE 10 MG PO TABS: 10.0000 mg | ORAL_TABLET | Freq: Every day | ORAL | 3 refills | Status: AC

## 2019-11-13 NOTE — Patient Instructions (Addendum)
Cholesterol Content in Foods Cholesterol is a waxy, fat-like substance that helps to carry fat in the blood. The body needs cholesterol in small amounts, but too much cholesterol can cause damage to the arteries and heart. Most people should eat less than 200 milligrams (mg) of cholesterol a day. Foods with cholesterol  Cholesterol is found in animal-based foods, such as meat, seafood, and dairy. Generally, low-fat dairy and lean meats have less cholesterol than full-fat dairy and fatty meats. The milligrams of cholesterol per serving (mg per serving) of common cholesterol-containing foods are listed below. Meat and other proteins  Egg -- one large whole egg has 186 mg.  Veal shank -- 4 oz has 141 mg.  Lean ground turkey (93% lean) -- 4 oz has 118 mg.  Fat-trimmed lamb loin -- 4 oz has 106 mg.  Lean ground beef (90% lean) -- 4 oz has 100 mg.  Lobster -- 3.5 oz has 90 mg.  Pork loin chops -- 4 oz has 86 mg.  Canned salmon -- 3.5 oz has 83 mg.  Fat-trimmed beef top loin -- 4 oz has 78 mg.  Frankfurter -- 1 frank (3.5 oz) has 77 mg.  Crab -- 3.5 oz has 71 mg.  Roasted chicken without skin, white meat -- 4 oz has 66 mg.  Light bologna -- 2 oz has 45 mg.  Deli-cut turkey -- 2 oz has 31 mg.  Canned tuna -- 3.5 oz has 31 mg.  Bacon -- 1 oz has 29 mg.  Oysters and mussels (raw) -- 3.5 oz has 25 mg.  Mackerel -- 1 oz has 22 mg.  Trout -- 1 oz has 20 mg.  Pork sausage -- 1 link (1 oz) has 17 mg.  Salmon -- 1 oz has 16 mg.  Tilapia -- 1 oz has 14 mg. Dairy  Soft-serve ice cream --  cup (4 oz) has 103 mg.  Whole-milk yogurt -- 1 cup (8 oz) has 29 mg.  Cheddar cheese -- 1 oz has 28 mg.  American cheese -- 1 oz has 28 mg.  Whole milk -- 1 cup (8 oz) has 23 mg.  2% milk -- 1 cup (8 oz) has 18 mg.  Cream cheese -- 1 tablespoon (Tbsp) has 15 mg.  Cottage cheese --  cup (4 oz) has 14 mg.  Low-fat (1%) milk -- 1 cup (8 oz) has 10 mg.  Sour cream -- 1 Tbsp has 8.5  mg.  Low-fat yogurt -- 1 cup (8 oz) has 8 mg.  Nonfat Greek yogurt -- 1 cup (8 oz) has 7 mg.  Half-and-half cream -- 1 Tbsp has 5 mg. Fats and oils  Cod liver oil -- 1 tablespoon (Tbsp) has 82 mg.  Butter -- 1 Tbsp has 15 mg.  Lard -- 1 Tbsp has 14 mg.  Bacon grease -- 1 Tbsp has 14 mg.  Mayonnaise -- 1 Tbsp has 5-10 mg.  Margarine -- 1 Tbsp has 3-10 mg. Exact amounts of cholesterol in these foods may vary depending on specific ingredients and brands. Foods without cholesterol Most plant-based foods do not have cholesterol unless you combine them with a food that has cholesterol. Foods without cholesterol include:  Grains and cereals.  Vegetables.  Fruits.  Vegetable oils, such as olive, canola, and sunflower oil.  Legumes, such as peas, beans, and lentils.  Nuts and seeds.  Egg whites. Summary  The body needs cholesterol in small amounts, but too much cholesterol can cause damage to the arteries and heart.    Most people should eat less than 200 milligrams (mg) of cholesterol a day. °This information is not intended to replace advice given to you by your health care provider. Make sure you discuss any questions you have with your health care provider. °Document Revised: 06/28/2017 Document Reviewed: 03/12/2017 °Elsevier Patient Education © 2020 Elsevier Inc. ° ° ° °DASH Eating Plan °DASH stands for "Dietary Approaches to Stop Hypertension." The DASH eating plan is a healthy eating plan that has been shown to reduce high blood pressure (hypertension). It may also reduce your risk for type 2 diabetes, heart disease, and stroke. The DASH eating plan may also help with weight loss. °What are tips for following this plan? ° °General guidelines °· Avoid eating more than 2,300 mg (milligrams) of salt (sodium) a day. If you have hypertension, you may need to reduce your sodium intake to 1,500 mg a day. °· Limit alcohol intake to no more than 1 drink a day for nonpregnant women and 2  drinks a day for men. One drink equals 12 oz of beer, 5 oz of wine, or 1½ oz of hard liquor. °· Work with your health care provider to maintain a healthy body weight or to lose weight. Ask what an ideal weight is for you. °· Get at least 30 minutes of exercise that causes your heart to beat faster (aerobic exercise) most days of the week. Activities may include walking, swimming, or biking. °· Work with your health care provider or diet and nutrition specialist (dietitian) to adjust your eating plan to your individual calorie needs. °Reading food labels ° °· Check food labels for the amount of sodium per serving. Choose foods with less than 5 percent of the Daily Value of sodium. Generally, foods with less than 300 mg of sodium per serving fit into this eating plan. °· To find whole grains, look for the word "whole" as the first word in the ingredient list. °Shopping °· Buy products labeled as "low-sodium" or "no salt added." °· Buy fresh foods. Avoid canned foods and premade or frozen meals. °Cooking °· Avoid adding salt when cooking. Use salt-free seasonings or herbs instead of table salt or sea salt. Check with your health care provider or pharmacist before using salt substitutes. °· Do not fry foods. Cook foods using healthy methods such as baking, boiling, grilling, and broiling instead. °· Cook with heart-healthy oils, such as olive, canola, soybean, or sunflower oil. °Meal planning °· Eat a balanced diet that includes: °? 5 or more servings of fruits and vegetables each day. At each meal, try to fill half of your plate with fruits and vegetables. °? Up to 6-8 servings of whole grains each day. °? Less than 6 oz of lean meat, poultry, or fish each day. A 3-oz serving of meat is about the same size as a deck of cards. One egg equals 1 oz. °? 2 servings of low-fat dairy each day. °? A serving of nuts, seeds, or beans 5 times each week. °? Heart-healthy fats. Healthy fats called Omega-3 fatty acids are found in  foods such as flaxseeds and coldwater fish, like sardines, salmon, and mackerel. °· Limit how much you eat of the following: °? Canned or prepackaged foods. °? Food that is high in trans fat, such as fried foods. °? Food that is high in saturated fat, such as fatty meat. °? Sweets, desserts, sugary drinks, and other foods with added sugar. °? Full-fat dairy products. °· Do not salt foods before eating. °· Try   to eat at least 2 vegetarian meals each week. °· Eat more home-cooked food and less restaurant, buffet, and fast food. °· When eating at a restaurant, ask that your food be prepared with less salt or no salt, if possible. °What foods are recommended? °The items listed may not be a complete list. Talk with your dietitian about what dietary choices are best for you. °Grains °Whole-grain or whole-wheat bread. Whole-grain or whole-wheat pasta. Brown rice. Oatmeal. Quinoa. Bulgur. Whole-grain and low-sodium cereals. Pita bread. Low-fat, low-sodium crackers. Whole-wheat flour tortillas. °Vegetables °Fresh or frozen vegetables (raw, steamed, roasted, or grilled). Low-sodium or reduced-sodium tomato and vegetable juice. Low-sodium or reduced-sodium tomato sauce and tomato paste. Low-sodium or reduced-sodium canned vegetables. °Fruits °All fresh, dried, or frozen fruit. Canned fruit in natural juice (without added sugar). °Meat and other protein foods °Skinless chicken or turkey. Ground chicken or turkey. Pork with fat trimmed off. Fish and seafood. Egg whites. Dried beans, peas, or lentils. Unsalted nuts, nut butters, and seeds. Unsalted canned beans. Lean cuts of beef with fat trimmed off. Low-sodium, lean deli meat. °Dairy °Low-fat (1%) or fat-free (skim) milk. Fat-free, low-fat, or reduced-fat cheeses. Nonfat, low-sodium ricotta or cottage cheese. Low-fat or nonfat yogurt. Low-fat, low-sodium cheese. °Fats and oils °Soft margarine without trans fats. Vegetable oil. Low-fat, reduced-fat, or light mayonnaise and salad  dressings (reduced-sodium). Canola, safflower, olive, soybean, and sunflower oils. Avocado. °Seasoning and other foods °Herbs. Spices. Seasoning mixes without salt. Unsalted popcorn and pretzels. Fat-free sweets. °What foods are not recommended? °The items listed may not be a complete list. Talk with your dietitian about what dietary choices are best for you. °Grains °Baked goods made with fat, such as croissants, muffins, or some breads. Dry pasta or rice meal packs. °Vegetables °Creamed or fried vegetables. Vegetables in a cheese sauce. Regular canned vegetables (not low-sodium or reduced-sodium). Regular canned tomato sauce and paste (not low-sodium or reduced-sodium). Regular tomato and vegetable juice (not low-sodium or reduced-sodium). Pickles. Olives. °Fruits °Canned fruit in a light or heavy syrup. Fried fruit. Fruit in cream or butter sauce. °Meat and other protein foods °Fatty cuts of meat. Ribs. Fried meat. Bacon. Sausage. Bologna and other processed lunch meats. Salami. Fatback. Hotdogs. Bratwurst. Salted nuts and seeds. Canned beans with added salt. Canned or smoked fish. Whole eggs or egg yolks. Chicken or turkey with skin. °Dairy °Whole or 2% milk, cream, and half-and-half. Whole or full-fat cream cheese. Whole-fat or sweetened yogurt. Full-fat cheese. Nondairy creamers. Whipped toppings. Processed cheese and cheese spreads. °Fats and oils °Butter. Stick margarine. Lard. Shortening. Ghee. Bacon fat. Tropical oils, such as coconut, palm kernel, or palm oil. °Seasoning and other foods °Salted popcorn and pretzels. Onion salt, garlic salt, seasoned salt, table salt, and sea salt. Worcestershire sauce. Tartar sauce. Barbecue sauce. Teriyaki sauce. Soy sauce, including reduced-sodium. Steak sauce. Canned and packaged gravies. Fish sauce. Oyster sauce. Cocktail sauce. Horseradish that you find on the shelf. Ketchup. Mustard. Meat flavorings and tenderizers. Bouillon cubes. Hot sauce and Tabasco sauce.  Premade or packaged marinades. Premade or packaged taco seasonings. Relishes. Regular salad dressings. °Where to find more information: °· National Heart, Lung, and Blood Institute: www.nhlbi.nih.gov °· American Heart Association: www.heart.org °Summary °· The DASH eating plan is a healthy eating plan that has been shown to reduce high blood pressure (hypertension). It may also reduce your risk for type 2 diabetes, heart disease, and stroke. °· With the DASH eating plan, you should limit salt (sodium) intake to 2,300 mg a day. If you   may need to reduce your sodium intake to 1,500 mg a day.  When on the DASH eating plan, aim to eat more fresh fruits and vegetables, whole grains, lean proteins, low-fat dairy, and heart-healthy fats.  Work with your health care provider or diet and nutrition specialist (dietitian) to adjust your eating plan to your individual calorie needs. This information is not intended to replace advice given to you by your health care provider. Make sure you discuss any questions you have with your health care provider. Document Revised: 06/28/2017 Document Reviewed: 07/09/2016 Elsevier Patient Education  2020 [employer].  Reviewed exercise activity level and encouraged to exercise 30-40 minutes 3-4 days a week to help with weight loss.

## 2019-11-14 LAB — COMPREHENSIVE METABOLIC PANEL
ALT: 68 IU/L — ABNORMAL HIGH (ref 0–44)
AST: 37 IU/L (ref 0–40)
Albumin/Globulin Ratio: 1.6 (ref 1.2–2.2)
Albumin: 4.6 g/dL (ref 3.8–4.8)
Alkaline Phosphatase: 94 IU/L (ref 39–117)
BUN/Creatinine Ratio: 11 (ref 10–24)
BUN: 12 mg/dL (ref 8–27)
Bilirubin Total: 0.5 mg/dL (ref 0.0–1.2)
CO2: 20 mmol/L (ref 20–29)
Calcium: 9.5 mg/dL (ref 8.6–10.2)
Chloride: 103 mmol/L (ref 96–106)
Creatinine, Ser: 1.08 mg/dL (ref 0.76–1.27)
GFR calc Af Amer: 83 mL/min/{1.73_m2} (ref >59)
GFR calc non Af Amer: 72 mL/min/{1.73_m2} (ref >59)
Globulin, Total: 2.9 g/dL (ref 1.5–4.5)
Glucose: 103 mg/dL — ABNORMAL HIGH (ref 65–99)
Potassium: 4.5 mmol/L (ref 3.5–5.2)
Sodium: 140 mmol/L (ref 134–144)
Total Protein: 7.5 g/dL (ref 6.0–8.5)

## 2019-11-14 LAB — CBC WITH DIFFERENTIAL/PLATELET
Basophils Absolute: 0 10*3/uL (ref 0.0–0.2)
Basos: 1 %
EOS (ABSOLUTE): 0.1 10*3/uL (ref 0.0–0.4)
Eos: 1 %
Hematocrit: 45.8 % (ref 37.5–51.0)
Hemoglobin: 16 g/dL (ref 13.0–17.7)
Immature Grans (Abs): 0 10*3/uL (ref 0.0–0.1)
Immature Granulocytes: 0 %
Lymphocytes Absolute: 1.3 10*3/uL (ref 0.7–3.1)
Lymphs: 26 %
MCH: 31.1 pg (ref 26.6–33.0)
MCHC: 34.9 g/dL (ref 31.5–35.7)
MCV: 89 fL (ref 79–97)
Monocytes Absolute: 0.5 10*3/uL (ref 0.1–0.9)
Monocytes: 10 %
Neutrophils Absolute: 3.2 10*3/uL (ref 1.4–7.0)
Neutrophils: 62 %
Platelets: 195 10*3/uL (ref 150–450)
RBC: 5.14 x10E6/uL (ref 4.14–5.80)
RDW: 13.1 % (ref 11.6–15.4)
WBC: 5.1 10*3/uL (ref 3.4–10.8)

## 2019-11-14 LAB — PSA: Prostate Specific Ag, Serum: 3.8 ng/mL (ref 0.0–4.0)

## 2019-11-14 LAB — TSH: TSH: 1.26 u[IU]/mL (ref 0.450–4.500)

## 2019-11-16 ENCOUNTER — Telehealth: Payer: Self-pay

## 2019-11-16 ENCOUNTER — Ambulatory Visit: Payer: Self-pay

## 2019-11-16 NOTE — Telephone Encounter (Signed)
Incoming call from Patient requesting lab resuluts.  Provided notes of Bradley Kern PA 4/19/21Patient voiced understanding.

## 2019-11-16 NOTE — Telephone Encounter (Signed)
Lab results given in nurse triage encounter on 11/16/19.

## 2019-11-16 NOTE — Telephone Encounter (Signed)
-----   Message from Tamsen Roers, [place] sent at 11/16/2019  8:24 AM EDT ----- All blood tests essentially normal except slight increase in ALT. Increase water intake and restrict ETOH intake to no more than 1 in any 24 hours. Recheck progress in 6 months.

## 2019-11-16 NOTE — Telephone Encounter (Signed)
LMTCB, [facility] Triage Nurse may give patient results  

## 2019-12-15 ENCOUNTER — Other Ambulatory Visit: Payer: Self-pay

## 2019-12-15 ENCOUNTER — Encounter: Payer: Self-pay | Admitting: Emergency Medicine

## 2019-12-15 ENCOUNTER — Emergency Department: Payer: 59

## 2019-12-15 ENCOUNTER — Inpatient Hospital Stay
Admission: EM | Admit: 2019-12-15 | Discharge: 2019-12-17 | DRG: 208 | Disposition: A | Discharge status: Other Acute Inpatient Hospital | Payer: 59 | Attending: Pulmonary Disease | Admitting: Pulmonary Disease

## 2019-12-15 DIAGNOSIS — J80 Acute respiratory distress syndrome: Secondary | ICD-10-CM | POA: Diagnosis present

## 2019-12-15 DIAGNOSIS — Z283 Underimmunization status: Secondary | ICD-10-CM

## 2019-12-15 DIAGNOSIS — F40240 Claustrophobia: Secondary | ICD-10-CM | POA: Diagnosis present

## 2019-12-15 DIAGNOSIS — J9622 Acute and chronic respiratory failure with hypercapnia: Secondary | ICD-10-CM | POA: Diagnosis not present

## 2019-12-15 DIAGNOSIS — J1282 Pneumonia due to coronavirus disease 2019: Secondary | ICD-10-CM | POA: Diagnosis present

## 2019-12-15 DIAGNOSIS — Z0184 Encounter for antibody response examination: Secondary | ICD-10-CM | POA: Diagnosis not present

## 2019-12-15 DIAGNOSIS — Z20822 Contact with and (suspected) exposure to covid-19: Secondary | ICD-10-CM | POA: Diagnosis present

## 2019-12-15 DIAGNOSIS — Z6834 Body mass index (BMI) 34.0-34.9, adult: Secondary | ICD-10-CM | POA: Diagnosis not present

## 2019-12-15 DIAGNOSIS — Z87891 Personal history of nicotine dependence: Secondary | ICD-10-CM | POA: Diagnosis not present

## 2019-12-15 DIAGNOSIS — I5031 Acute diastolic (congestive) heart failure: Secondary | ICD-10-CM | POA: Diagnosis not present

## 2019-12-15 DIAGNOSIS — U071 COVID-19: Principal | ICD-10-CM | POA: Diagnosis present

## 2019-12-15 DIAGNOSIS — E441 Mild protein-calorie malnutrition: Secondary | ICD-10-CM | POA: Diagnosis present

## 2019-12-15 DIAGNOSIS — J9601 Acute respiratory failure with hypoxia: Secondary | ICD-10-CM | POA: Diagnosis present

## 2019-12-15 DIAGNOSIS — Z8249 Family history of ischemic heart disease and other diseases of the circulatory system: Secondary | ICD-10-CM

## 2019-12-15 DIAGNOSIS — N170 Acute kidney failure with tubular necrosis: Secondary | ICD-10-CM | POA: Diagnosis present

## 2019-12-15 DIAGNOSIS — J9621 Acute and chronic respiratory failure with hypoxia: Secondary | ICD-10-CM | POA: Diagnosis not present

## 2019-12-15 DIAGNOSIS — Z809 Family history of malignant neoplasm, unspecified: Secondary | ICD-10-CM | POA: Diagnosis not present

## 2019-12-15 DIAGNOSIS — Z452 Encounter for adjustment and management of vascular access device: Secondary | ICD-10-CM

## 2019-12-15 LAB — CBC WITH DIFFERENTIAL/PLATELET
Abs Immature Granulocytes: 0.08 10*3/uL — ABNORMAL HIGH (ref 0.00–0.07)
Basophils Absolute: 0 10*3/uL (ref 0.0–0.1)
Basophils Relative: 0 %
Eosinophils Absolute: 0 10*3/uL (ref 0.0–0.5)
Eosinophils Relative: 0 %
HCT: 41.7 % (ref 39.0–52.0)
Hemoglobin: 14.3 g/dL (ref 13.0–17.0)
Immature Granulocytes: 1 %
Lymphocytes Relative: 16 %
Lymphs Abs: 1 10*3/uL (ref 0.7–4.0)
MCH: 30.6 pg (ref 26.0–34.0)
MCHC: 34.3 g/dL (ref 30.0–36.0)
MCV: 89.3 fL (ref 80.0–100.0)
Monocytes Absolute: 0.3 10*3/uL (ref 0.1–1.0)
Monocytes Relative: 5 %
Neutro Abs: 5 10*3/uL (ref 1.7–7.7)
Neutrophils Relative %: 78 %
Platelets: 173 10*3/uL (ref 150–400)
RBC: 4.67 MIL/uL (ref 4.22–5.81)
RDW: 12.5 % (ref 11.5–15.5)
Smear Review: NORMAL
WBC: 6.3 10*3/uL (ref 4.0–10.5)
nRBC: 0.3 % — ABNORMAL HIGH (ref 0.0–0.2)

## 2019-12-15 LAB — COMPREHENSIVE METABOLIC PANEL
ALT: 68 U/L — ABNORMAL HIGH (ref 0–44)
AST: 66 U/L — ABNORMAL HIGH (ref 15–41)
Albumin: 2.9 g/dL — ABNORMAL LOW (ref 3.5–5.0)
Alkaline Phosphatase: 42 U/L (ref 38–126)
Anion gap: 11 (ref 5–15)
BUN: 60 mg/dL — ABNORMAL HIGH (ref 8–23)
CO2: 21 mmol/L — ABNORMAL LOW (ref 22–32)
Calcium: 7.6 mg/dL — ABNORMAL LOW (ref 8.9–10.3)
Chloride: 108 mmol/L (ref 98–111)
Creatinine, Ser: 1.92 mg/dL — ABNORMAL HIGH (ref 0.61–1.24)
GFR calc Af Amer: 42 mL/min — ABNORMAL LOW (ref >60)
GFR calc non Af Amer: 36 mL/min — ABNORMAL LOW (ref >60)
Glucose, Bld: 174 mg/dL — ABNORMAL HIGH (ref 70–99)
Potassium: 3.7 mmol/L (ref 3.5–5.1)
Sodium: 140 mmol/L (ref 135–145)
Total Bilirubin: 1.2 mg/dL (ref 0.3–1.2)
Total Protein: 7.1 g/dL (ref 6.5–8.1)

## 2019-12-15 LAB — BLOOD GAS, VENOUS
Acid-base deficit: 3.3 mmol/L — ABNORMAL HIGH (ref 0.0–2.0)
Bicarbonate: 21.3 mmol/L (ref 20.0–28.0)
O2 Saturation: 76.3 %
Patient temperature: 37
pCO2, Ven: 36 mmHg — ABNORMAL LOW (ref 44.0–60.0)
pH, Ven: 7.38 (ref 7.250–7.430)
pO2, Ven: 42 mmHg (ref 32.0–45.0)

## 2019-12-15 LAB — PROTIME-INR
INR: 1.3 — ABNORMAL HIGH (ref 0.8–1.2)
Prothrombin Time: 15.4 seconds — ABNORMAL HIGH (ref 11.4–15.2)

## 2019-12-15 LAB — PROCALCITONIN: Procalcitonin: 0.24 ng/mL

## 2019-12-15 LAB — MRSA PCR SCREENING: MRSA by PCR: NEGATIVE

## 2019-12-15 LAB — AMYLASE: Amylase: 142 U/L — ABNORMAL HIGH (ref 28–100)

## 2019-12-15 LAB — LACTIC ACID, PLASMA: Lactic Acid, Venous: 2 mmol/L (ref 0.5–1.9)

## 2019-12-15 LAB — POC SARS CORONAVIRUS 2 AG: SARS Coronavirus 2 Ag: NEGATIVE

## 2019-12-15 LAB — LACTATE DEHYDROGENASE: LDH: 952 U/L — ABNORMAL HIGH (ref 98–192)

## 2019-12-15 LAB — FIBRINOGEN: Fibrinogen: 619 mg/dL — ABNORMAL HIGH (ref 210–475)

## 2019-12-15 LAB — LIPASE, BLOOD: Lipase: 137 U/L — ABNORMAL HIGH (ref 11–51)

## 2019-12-15 LAB — HIV ANTIBODY (ROUTINE TESTING W REFLEX): HIV Screen 4th Generation wRfx: NONREACTIVE

## 2019-12-15 LAB — GLUCOSE, CAPILLARY: Glucose-Capillary: 209 mg/dL — ABNORMAL HIGH (ref 70–99)

## 2019-12-15 LAB — VITAMIN D 25 HYDROXY (VIT D DEFICIENCY, FRACTURES): Vit D, 25-Hydroxy: 13.97 ng/mL — ABNORMAL LOW (ref 30–100)

## 2019-12-15 LAB — SARS CORONAVIRUS 2 BY RT PCR (HOSPITAL ORDER, PERFORMED IN ~~LOC~~ HOSPITAL LAB): SARS Coronavirus 2: NEGATIVE

## 2019-12-15 LAB — FERRITIN: Ferritin: 968 ng/mL — ABNORMAL HIGH (ref 24–336)

## 2019-12-15 LAB — BRAIN NATRIURETIC PEPTIDE: B Natriuretic Peptide: 83.5 pg/mL (ref 0.0–100.0)

## 2019-12-15 LAB — TROPONIN I (HIGH SENSITIVITY): Troponin I (High Sensitivity): 109 ng/L (ref <18)

## 2019-12-15 LAB — FIBRIN DERIVATIVES D-DIMER (ARMC ONLY): Fibrin derivatives D-dimer (ARMC): 5350.14 ng/mL (FEU) — ABNORMAL HIGH (ref 0.00–499.00)

## 2019-12-15 MED ORDER — IVERMECTIN 3 MG PO TABS
200.0000 ug/kg | ORAL_TABLET | Freq: Every day | ORAL | Status: DC
  Filled 2019-12-15: qty 7

## 2019-12-15 MED ORDER — VANCOMYCIN HCL IN DEXTROSE 1-5 GM/200ML-% IV SOLN
1000.0000 mg | Freq: Once | INTRAVENOUS | Status: AC
  Administered 2019-12-15: 1000 mg via INTRAVENOUS
  Filled 2019-12-15: qty 200

## 2019-12-15 MED ORDER — DOCUSATE SODIUM 100 MG PO CAPS: 100.0000 mg | ORAL_CAPSULE | Freq: Two times a day (BID) | ORAL | Status: DC | PRN

## 2019-12-15 MED ORDER — IOHEXOL 300 MG/ML  SOLN
75.0000 mL | Freq: Once | INTRAMUSCULAR | Status: AC | PRN
  Administered 2019-12-15: 75 mL via INTRAVENOUS

## 2019-12-15 MED ORDER — FAMOTIDINE IN NACL 20-0.9 MG/50ML-% IV SOLN
20.0000 mg | Freq: Two times a day (BID) | INTRAVENOUS | Status: DC
  Administered 2019-12-15 – 2019-12-16 (×3): 20 mg via INTRAVENOUS
  Filled 2019-12-15 (×3): qty 50

## 2019-12-15 MED ORDER — CHLORHEXIDINE GLUCONATE 0.12 % MT SOLN
15.0000 mL | Freq: Two times a day (BID) | OROMUCOSAL | Status: DC
  Administered 2019-12-16 (×2): 15 mL via OROMUCOSAL
  Filled 2019-12-15 (×2): qty 15

## 2019-12-15 MED ORDER — ORAL CARE MOUTH RINSE
15.0000 mL | Freq: Two times a day (BID) | OROMUCOSAL | Status: DC
  Administered 2019-12-16 – 2019-12-17 (×2): 15 mL via OROMUCOSAL

## 2019-12-15 MED ORDER — DEXAMETHASONE SODIUM PHOSPHATE 10 MG/ML IJ SOLN
10.0000 mg | Freq: Once | INTRAMUSCULAR | Status: AC
  Administered 2019-12-15: 10 mg via INTRAVENOUS
  Filled 2019-12-15: qty 1

## 2019-12-15 MED ORDER — CHLORHEXIDINE GLUCONATE 0.12 % MT SOLN
15.0000 mL | Freq: Two times a day (BID) | OROMUCOSAL | Status: DC
  Administered 2019-12-16 – 2019-12-17 (×2): 15 mL via OROMUCOSAL
  Filled 2019-12-15 (×4): qty 15

## 2019-12-15 MED ORDER — IVERMECTIN 3 MG PO TABS
200.0000 ug/kg | ORAL_TABLET | Freq: Every day | ORAL | Status: DC
  Administered 2019-12-16: 19500 ug via ORAL
  Filled 2019-12-15 (×4): qty 7

## 2019-12-15 MED ORDER — ORAL CARE MOUTH RINSE
15.0000 mL | Freq: Two times a day (BID) | OROMUCOSAL | Status: DC
  Administered 2019-12-16: 15 mL via OROMUCOSAL

## 2019-12-15 MED ORDER — POLYETHYLENE GLYCOL 3350 17 G PO PACK: 17.0000 g | PACK | Freq: Every day | ORAL | Status: DC | PRN

## 2019-12-15 MED ORDER — ENOXAPARIN SODIUM 40 MG/0.4ML ~~LOC~~ SOLN: 30.0000 mg | SUBCUTANEOUS | Status: DC

## 2019-12-15 MED ORDER — ENOXAPARIN SODIUM 40 MG/0.4ML ~~LOC~~ SOLN
40.0000 mg | SUBCUTANEOUS | Status: DC
  Administered 2019-12-15 – 2019-12-16 (×2): 40 mg via SUBCUTANEOUS
  Filled 2019-12-15 (×2): qty 0.4

## 2019-12-15 MED ORDER — CHLORHEXIDINE GLUCONATE CLOTH 2 % EX PADS
6.0000 | MEDICATED_PAD | Freq: Every day | CUTANEOUS | Status: DC
  Administered 2019-12-15 – 2019-12-17 (×3): 6 via TOPICAL

## 2019-12-15 MED ORDER — PIPERACILLIN-TAZOBACTAM 3.375 G IVPB 30 MIN
3.3750 g | Freq: Once | INTRAVENOUS | Status: AC
  Administered 2019-12-15: 3.375 g via INTRAVENOUS
  Filled 2019-12-15: qty 50

## 2019-12-15 MED ORDER — SODIUM CHLORIDE 0.9 % IV SOLN: Freq: Once | INTRAVENOUS | Status: AC

## 2019-12-15 NOTE — ED Notes (Signed)
Placed on 100% high flow oxygen

## 2019-12-15 NOTE — Plan of Care (Signed)
Per care plan 

## 2019-12-15 NOTE — ED Provider Notes (Addendum)
[facility] Emergency Department Provider Note       Time seen: ----------------------------------------- 10:58 AM on 12/15/2019 -----------------------------------------   I have reviewed the vital signs and the nursing notes.  HISTORY   Chief Complaint No chief complaint on file.    HPI Bradley Chambers is a 64 y.o. male with no known past medical history who presents today for possible Covid.  Patient's son was diagnosed with Covid about a week ago.  Patient and son live in the same household.  Is been feeling ill for the last week but has not been tested.  T-max was 104.5.  He was somewhat confused.  Oxygen saturation was 54%.  On nonrebreather sats improved to the mid 80s.  No past medical history on file.  Past Surgical History:  Procedure Laterality Date  . FINGER SURGERY  2005  . KNEE SURGERY  1990    Allergies Patient has no known allergies.   Review of Systems Constitutional: Positive for fever Cardiovascular: Negative for chest pain. Respiratory: Positive for shortness of breath Gastrointestinal: Negative for abdominal pain, vomiting and diarrhea. Musculoskeletal: Negative for back pain. Skin: Negative for rash. Neurological: Negative for headaches, focal weakness or numbness.  All systems negative/normal/unremarkable except as stated in the HPI  ____________________________________________   PHYSICAL EXAM:  VITAL SIGNS: ED Triage Vitals  Enc Vitals Group     BP 12/15/19 1056 107/67     Pulse Rate 12/15/19 1054 99     Resp 12/15/19 1054 (!) 29     Temp 12/15/19 1056 (!) 102.5 F (39.2 C)     Temp Source 12/15/19 1054 Oral     SpO2 12/15/19 1053 (!) 78 %     Weight 12/15/19 1054 205 lb 3.2 oz (93.1 kg)     Height 12/15/19 1054 5\' 5"  (1.651 m)     Head Circumference --      Peak Flow --      Pain Score 12/15/19 1054 0     Pain Loc --      Pain Edu? --      Excl. in [place]? --     Constitutional: Alert, Mild to moderate distress Eyes:  Conjunctivae are normal. Normal extraocular movements. ENT      Head: Normocephalic and atraumatic.      Nose: No congestion/rhinnorhea.      Mouth/Throat: Mucous membranes are moist.      Neck: No stridor. Cardiovascular: Normal rate, regular rhythm. No murmurs, rubs, or gallops. Respiratory: Tachypnea with clear breath sounds Gastrointestinal: Soft and nontender. Normal bowel sounds Musculoskeletal: Nontender with normal range of motion in extremities. No lower extremity tenderness nor edema. Neurologic:  Normal speech and language. No gross focal neurologic deficits are appreciated.  Skin:  Skin is hot to touch. Psychiatric: Speech and behavior are normal.  ____________________________________________  EKG: Interpreted by me.  Sinus rhythm with rate of 96 bpm, normal PR interval, normal QRS, normal QT  ____________________________________________   LABS (pertinent positives/negatives)  Labs Reviewed  LACTIC ACID, PLASMA - Abnormal; Notable for the following components:      Result Value   Lactic Acid, Venous 2.0 (*)    All other components within normal limits  CBC WITH DIFFERENTIAL/PLATELET - Abnormal; Notable for the following components:   nRBC 0.3 (*)    Abs Immature Granulocytes 0.08 (*)    All other components within normal limits  COMPREHENSIVE METABOLIC PANEL - Abnormal; Notable for the following components:   CO2 21 (*)    Glucose, Bld  174 (*)    BUN 60 (*)    Creatinine, Ser 1.92 (*)    Calcium 7.6 (*)    Albumin 2.9 (*)    AST 66 (*)    ALT 68 (*)    GFR calc non Af Amer 36 (*)    GFR calc Af Amer 42 (*)    All other components within normal limits  FIBRIN DERIVATIVES D-DIMER ([facility] ONLY) - Abnormal; Notable for the following components:   Fibrin derivatives D-dimer ([facility]) 5,350.14 (*)    All other components within normal limits  LACTATE DEHYDROGENASE - Abnormal; Notable for the following components:   LDH 952 (*)    All other components within normal  limits  FERRITIN - Abnormal; Notable for the following components:   Ferritin 968 (*)    All other components within normal limits  FIBRINOGEN - Abnormal; Notable for the following components:   Fibrinogen 619 (*)    All other components within normal limits  BLOOD GAS, VENOUS - Abnormal; Notable for the following components:   pCO2, Ven 36 (*)    Acid-base deficit 3.3 (*)    All other components within normal limits  CULTURE, BLOOD (ROUTINE X 2)  CULTURE, BLOOD (ROUTINE X 2)  SARS CORONAVIRUS 2 BY RT PCR (HOSPITAL ORDER, PERFORMED IN [facility])  LACTIC ACID, PLASMA  VITAMIN D 25 HYDROXY (VIT D DEFICIENCY, FRACTURES)  POC SARS CORONAVIRUS 2 AG -  ED  POC SARS CORONAVIRUS 2 AG   CRITICAL CARE Performed by: Ulice Dash   Total critical care time: 30 minutes  Critical care time was exclusive of separately billable procedures and treating other patients.  Critical care was necessary to treat or prevent imminent or life-threatening deterioration.  Critical care was time spent personally by me on the following activities: development of treatment plan with patient and/or surrogate as well as nursing, discussions with consultants, evaluation of patient's response to treatment, examination of patient, obtaining history from patient or surrogate, ordering and performing treatments and interventions, ordering and review of laboratory studies, ordering and review of radiographic studies, pulse oximetry and re-evaluation of patient's condition.  RADIOLOGY  Images were viewed by me Chest x-ray IMPRESSION: 1. Marked bilateral pneumonia or alveolar edema. 2. Minimal bilateral pleural effusions. 3. Cardiomegaly.   DIFFERENTIAL DIAGNOSIS  COVID-19, pneumonia, PE, pneumothorax, sepsis  ASSESSMENT AND PLAN  Acute respiratory failure with hypoxia   Plan: The patient had presented for respiratory distress. Patient's labs are most suggestive of COVID-19 although  the POC test and PCR was negative. Patient's imaging revealed bilateral pneumonia or alveolar edema.  He was placed on high flow nasal cannula oxygen with sats in the mid 80s.  Patient also received IV Decadron, gentle IV fluids.  Patient was given broad-spectrum antibiotics as well and we switched him to BiPAP once his PCR test was negative.  I will discuss with the ICU doctor for admission.  Daryel November MD    Note: This note was generated in part or whole with voice recognition software. Voice recognition is usually quite accurate but there are transcription errors that can and very often do occur. I apologize for any typographical errors that were not detected and corrected.     Emily Filbert, MD 12/15/19 1302    Emily Filbert, MD 12/15/19 1309

## 2019-12-15 NOTE — H&P (Signed)
CRITICAL CARE PROGRESS NOTE    Name: Bradley Chambers MRN: [id number] DOB: [birth year]     LOS: 0   SUBJECTIVE FINDINGS & SIGNIFICANT EVENTS   Patient description:  This is a 64 yo M with hx of lifelong smoking, reports feeling dyspneic with intermittent flu like symptoms "on and off", came in to ED with febrile illness found to be severely hypoxemic requiring BIPAP 100% to achieve spO2 89%.  He has normal BNP,,procal and wbc count, with elevation of ddimer and feritin which all points to COVID 19.  His COVID swab and rapid PCR were negative but overall ED doctor and myself still think high prob of COVID.  He has not been vaccinated in past and we have SARC-coV antibodies in process.   Lines / Drains: PIVx2  Cultures / Sepsis markers: Blood cultures  Antibiotics: Ivermectin  S/p 1x vanco and zosyn     PAST MEDICAL HISTORY   History reviewed. No pertinent past medical history.   SURGICAL HISTORY   Past Surgical History:  Procedure Laterality Date  . FINGER SURGERY  2005  . KNEE SURGERY  1990     FAMILY HISTORY   Family History  Problem Relation Age of Onset  . Heart disease Mother   . Healthy Sister   . Healthy Brother   . Cancer Father   . Hypertension Father      SOCIAL HISTORY   Social History   Tobacco Use  . Smoking status: Former Games developer  . Smokeless tobacco: Never Used  . Tobacco comment: QUIT IN 1994  Substance Use Topics  . Alcohol use: Yes    Alcohol/week: 0.0 standard drinks    Comment: OCCASIONALLY  . Drug use: No     MEDICATIONS   Current Medication:  Current Facility-Administered Medications:  .  ivermectin (STROMECTOL) tablet 19,500 mcg, 200 mcg/kg (Order-Specific), Oral, Daily, Emily Filbert, MD .  vancomycin (VANCOCIN) IVPB 1000 mg/200 mL premix,  1,000 mg, Intravenous, Once, Emily Filbert, MD, Last Rate: 200 mL/hr at 12/15/19 1412, 1,000 mg at 12/15/19 1412  Current Outpatient Medications:  .  amLODipine (NORVASC) 10 MG tablet, Take 1 tablet (10 mg total) by mouth daily., Disp: 90 tablet, Rfl: 3 .  aspirin 81 MG tablet, Take 81 mg by mouth daily. , Disp: , Rfl:  .  fish oil-omega-3 fatty acids 1000 MG capsule, Take 1,000 mg by mouth daily. , Disp: , Rfl:  .  pravastatin (PRAVACHOL) 40 MG tablet, TAKE 1 TABLET BY MOUTH EVERYDAY AT BEDTIME (Patient taking differently: Take 40 mg by mouth at bedtime. ), Disp: 90 tablet, Rfl: 3 .  quinapril (ACCUPRIL) 20 MG tablet, Take 1 tablet (20 mg total) by mouth daily., Disp: 90 tablet, Rfl: 3    ALLERGIES   Patient has no known allergies.    REVIEW OF SYSTEMS   10 point ROS done and is neg except as per HPI  PHYSICAL EXAMINATION   Vital Signs: Temp:  [99.1 F (37.3 C)-102.5 F (39.2 C)] 99.1 F (37.3 C) (05/18 1219) Pulse Rate:  [68-100] 79 (05/18 1445) Resp:  [17-45] 17 (05/18 1445) BP: (90-119)/(57-86) 119/80 (05/18 1430) SpO2:  [78 %-93 %] 93 % (05/18 1445) FiO2 (%):  [100 %] 100 % (05/18 1300) Weight:  [93.1 kg] 93.1 kg (05/18 1054)  GENERAL acutely distressed due to hypoxemia HEAD: Normocephalic, atraumatic.  EYES: Pupils equal, round, reactive to light.  No scleral icterus.  MOUTH: Moist mucosal membrane. NECK: Supple. No thyromegaly. No nodules. No  JVD.  PULMONARY: rhochi bilaterally CARDIOVASCULAR: S1 and S2. Regular rate and rhythm. No murmurs, rubs, or gallops.  GASTROINTESTINAL: Soft, nontender, non-distended. No masses. Positive bowel sounds. No hepatosplenomegaly.  MUSCULOSKELETAL: No swelling, clubbing, or edema.  NEUROLOGIC: Mild distress due to acute illness SKIN:intact,warm,dry   PERTINENT DATA     Infusions: . vancomycin 1,000 mg (12/15/19 1412)   Scheduled Medications: . ivermectin  200 mcg/kg (Order-Specific) Oral Daily   PRN  Medications:  Hemodynamic parameters:   Intake/Output: No intake/output data recorded.  Ventilator  Settings: FiO2 (%):  [100 %] 100 %    LAB RESULTS:  Basic Metabolic Panel: Recent Labs  Lab 12/15/19 1100  NA 140  K 3.7  CL 108  CO2 21*  GLUCOSE 174*  BUN 60*  CREATININE 1.92*  CALCIUM 7.6*   Liver Function Tests: Recent Labs  Lab 12/15/19 1100  AST 66*  ALT 68*  ALKPHOS 42  BILITOT 1.2  PROT 7.1  ALBUMIN 2.9*   No results for input(s): LIPASE, AMYLASE in the last 168 hours. No results for input(s): AMMONIA in the last 168 hours. CBC: Recent Labs  Lab 12/15/19 1100  WBC 6.3  NEUTROABS 5.0  HGB 14.3  HCT 41.7  MCV 89.3  PLT 173   Cardiac Enzymes: No results for input(s): CKTOTAL, CKMB, CKMBINDEX, TROPONINI in the last 168 hours. BNP: Invalid input(s): POCBNP CBG: No results for input(s): GLUCAP in the last 168 hours.     IMAGING RESULTS:  Imaging: CT Angio Chest PE W and/or Wo Contrast  Result Date: 12/15/2019 CLINICAL DATA:  Shortness of breath.  COVID-19 positive EXAM: CT ANGIOGRAPHY CHEST WITH CONTRAST TECHNIQUE: Multidetector CT imaging of the chest was performed using the standard protocol during bolus administration of intravenous contrast. Multiplanar CT image reconstructions and MIPs were obtained to evaluate the vascular anatomy. CONTRAST:  57mL OMNIPAQUE IOHEXOL 300 MG/ML  SOLN COMPARISON:  Chest radiograph Dec 15, 2019 FINDINGS: Cardiovascular: There is no demonstrable pulmonary embolus. There is no appreciable thoracic aortic aneurysm or dissection. There is slight calcification at the origin of the left common carotid artery. Other visualized great vessels appear unremarkable. There is slight aortic atherosclerosis. There are occasional foci of coronary artery calcification. There is no pericardial effusion or pericardial thickening. Mediastinum/Nodes: Visualized thyroid appears unremarkable. There are multiple subcentimeter mediastinal  lymph nodes. There is a subcarinal lymph node measuring 1.3 x 1.3 cm. There is a lymph node in the superior left hilum measuring 1.0 x 1.0 cm. There is a small hiatal hernia. Lungs/Pleura: There is extensive airspace opacity throughout the lungs diffusely, with involvement to varying degrees in essentially all lobes in segments except for sparing of the apical segment of the right upper lobe. There is slight consolidation in the lower lobes. Most areas have a more ill-defined opacity. No pleural effusions evident. Upper Abdomen: There is a degree of reflux into the inferior vena cava and slight reflux into hepatic veins. Visualized upper abdominal structures otherwise appear unremarkable. Musculoskeletal: There is degenerative change in the thoracic spine. No blastic or lytic bone lesions. No evident chest wall lesions. Review of the MIP images confirms the above findings. IMPRESSION: 1. No demonstrable pulmonary embolus. No thoracic aortic aneurysm or dissection. There is aortic atherosclerosis as well as occasional foci of coronary artery calcification. 2. Widespread airspace opacity bilaterally consistent with widespread multifocal pneumonia. Given the clinical history, suspect atypical organism pneumonia. 3. Mild adenopathy, likely of reactive etiology given the extensive parenchymal lung abnormalities. 4.  Small hiatal hernia.  5. There is mild reflux into the inferior vena cava and hepatic veins, a finding that potentially may indicate a degree of increased right heart pressure. Aortic Atherosclerosis (ICD10-I70.0). Electronically Signed   By: Bretta Bang III M.D.   On: 12/15/2019 13:49   DG Chest Port 1 View  Result Date: 12/15/2019 CLINICAL DATA:  Dyspnea. EXAM: PORTABLE CHEST 1 VIEW COMPARISON:  Report dated 05/25/2011. FINDINGS: Enlarged cardiac silhouette. Marked patchy airspace opacity in both lungs with some sparing at the lung bases and left lung apex. Minimal bilateral pleural effusions.  Thoracic spine degenerative changes and mild scoliosis. IMPRESSION: 1. Marked bilateral pneumonia or alveolar edema. 2. Minimal bilateral pleural effusions. 3. Cardiomegaly. Electronically Signed   By: Beckie Salts M.D.   On: 12/15/2019 11:23   @PROBHOSP @ CT Angio Chest PE W and/or Wo Contrast  Result Date: 12/15/2019 CLINICAL DATA:  Shortness of breath.  COVID-19 positive EXAM: CT ANGIOGRAPHY CHEST WITH CONTRAST TECHNIQUE: Multidetector CT imaging of the chest was performed using the standard protocol during bolus administration of intravenous contrast. Multiplanar CT image reconstructions and MIPs were obtained to evaluate the vascular anatomy. CONTRAST:  110mL OMNIPAQUE IOHEXOL 300 MG/ML  SOLN COMPARISON:  Chest radiograph Dec 15, 2019 FINDINGS: Cardiovascular: There is no demonstrable pulmonary embolus. There is no appreciable thoracic aortic aneurysm or dissection. There is slight calcification at the origin of the left common carotid artery. Other visualized great vessels appear unremarkable. There is slight aortic atherosclerosis. There are occasional foci of coronary artery calcification. There is no pericardial effusion or pericardial thickening. Mediastinum/Nodes: Visualized thyroid appears unremarkable. There are multiple subcentimeter mediastinal lymph nodes. There is a subcarinal lymph node measuring 1.3 x 1.3 cm. There is a lymph node in the superior left hilum measuring 1.0 x 1.0 cm. There is a small hiatal hernia. Lungs/Pleura: There is extensive airspace opacity throughout the lungs diffusely, with involvement to varying degrees in essentially all lobes in segments except for sparing of the apical segment of the right upper lobe. There is slight consolidation in the lower lobes. Most areas have a more ill-defined opacity. No pleural effusions evident. Upper Abdomen: There is a degree of reflux into the inferior vena cava and slight reflux into hepatic veins. Visualized upper abdominal structures  otherwise appear unremarkable. Musculoskeletal: There is degenerative change in the thoracic spine. No blastic or lytic bone lesions. No evident chest wall lesions. Review of the MIP images confirms the above findings. IMPRESSION: 1. No demonstrable pulmonary embolus. No thoracic aortic aneurysm or dissection. There is aortic atherosclerosis as well as occasional foci of coronary artery calcification. 2. Widespread airspace opacity bilaterally consistent with widespread multifocal pneumonia. Given the clinical history, suspect atypical organism pneumonia. 3. Mild adenopathy, likely of reactive etiology given the extensive parenchymal lung abnormalities. 4.  Small hiatal hernia. 5. There is mild reflux into the inferior vena cava and hepatic veins, a finding that potentially may indicate a degree of increased right heart pressure. Aortic Atherosclerosis (ICD10-I70.0). Electronically Signed   By: Dec 17, 2019 III M.D.   On: 12/15/2019 13:49   DG Chest Port 1 View  Result Date: 12/15/2019 CLINICAL DATA:  Dyspnea. EXAM: PORTABLE CHEST 1 VIEW COMPARISON:  Report dated 05/25/2011. FINDINGS: Enlarged cardiac silhouette. Marked patchy airspace opacity in both lungs with some sparing at the lung bases and left lung apex. Minimal bilateral pleural effusions. Thoracic spine degenerative changes and mild scoliosis. IMPRESSION: 1. Marked bilateral pneumonia or alveolar edema. 2. Minimal bilateral pleural effusions. 3. Cardiomegaly. Electronically Signed  By: Claudie Revering M.D.   On: 12/15/2019 11:23        ASSESSMENT AND PLAN    -Multidisciplinary rounds held today  Acute Hypoxic Respiratory Failure -continue NIV BIPAP support -high risk for intubation -RVP and SARS-coV antibody testing -respiratory and blood cultures -empiric ivermectin given in ED -legionella and strep pmeunomia urine antigen -lipase/amylase to R/O panc associated ards -continue Bronchodilator Therapy -TTE to rule out CHF - note  BNP is 88   Acute Renal Failure stage 2-most likely due to ATN -follow chem 7 -follow UO -continue Foley Catheter-assess need daily   ID -continue IV abx as prescibed -follow up cultures  GI/Nutrition GI PROPHYLAXIS as indicated DIET-->TF's as tolerated Constipation protocol as indicated  ENDO - ICU hypoglycemic\Hyperglycemia protocol -check FSBS per protocol   ELECTROLYTES -follow labs as needed -replace as needed -pharmacy consultation   DVT/GI PRX ordered -SCDs  TRANSFUSIONS AS NEEDED MONITOR FSBS ASSESS the need for LABS as needed   Critical care provider statement:    Critical care time (minutes):  109   Critical care time was exclusive of:  Separately billable procedures and treating other patients   Critical care was necessary to treat or prevent imminent or life-threatening deterioration of the following conditions:  acute hypoxemic respiratory failure, acute kidney injury , multipple comorbid coditions   Critical care was time spent personally by me on the following activities:  Development of treatment plan with patient or surrogate, discussions with consultants, evaluation of patient's response to treatment, examination of patient, obtaining history from patient or surrogate, ordering and performing treatments and interventions, ordering and review of laboratory studies and re-evaluation of patient's condition.  I assumed direction of critical care for this patient from another provider in my specialty: no    This document was prepared using Dragon voice recognition software and may include unintentional dictation errors.    Ottie Glazier, M.D.  Division of [place]

## 2019-12-15 NOTE — ED Notes (Signed)
[facility]  CALLED  FOR  TRANSFER  PER  DR  J  WILLIAMS  MD 

## 2019-12-15 NOTE — ED Notes (Signed)
RT at bedside.

## 2019-12-15 NOTE — ED Triage Notes (Signed)
Arrives via EMS>  Son diagnosed with COVID about one week ago. Patient and son live in the same household.  Patient has also been feeling ill x 1 week, has not been tested.  T 104.5  -- 1 gm tylenol given.  Per report initial OXygen saturation 54%.  Placed on 100% NRB and 10-15L [place] sats improved to 85%  Per report, patient slightly confused

## 2019-12-15 NOTE — ED Notes (Signed)
Patient states he has been sick for three weeks.  No energy.  Patient is a poor historian.

## 2019-12-15 NOTE — Progress Notes (Signed)
PHARMACIST - PHYSICIAN COMMUNICATION  CONCERNING:  Enoxaparin (Lovenox) for DVT Prophylaxis    RECOMMENDATION: Patient was prescribed enoxaparin 30 mg q24 hours for VTE prophylaxis.   Filed Weights   12/15/19 1054  Weight: 93.1 kg (205 lb 3.2 oz)    Body mass index is 34.15 kg/m.  Estimated Creatinine Clearance: 40.7 mL/min (A) (by C-G formula based on SCr of 1.92 mg/dL (H)).   Based on [facility] policy patient is candidate for enoxaparin 40 mg every 24 hours based on CrCl > 23ml/min  DESCRIPTION: Pharmacy has adjusted enoxaparin dose per [facility] policy.   Patient is now receiving enoxaparin 40 mg every 24 hours.  Pricilla Riffle, PharmD Clinical Pharmacist  12/15/2019 7:17 PM

## 2019-12-15 NOTE — ED Provider Notes (Signed)
CTA results:  IMPRESSION: 1. No demonstrable pulmonary embolus. No thoracic aortic aneurysm or dissection. There is aortic atherosclerosis as well as occasional foci of coronary artery calcification.  2. Widespread airspace opacity bilaterally consistent with widespread multifocal pneumonia. Given the clinical history, suspect atypical organism pneumonia.  3. Mild adenopathy, likely of reactive etiology given the extensive parenchymal lung abnormalities.  4.  Small hiatal hernia.  5. There is mild reflux into the inferior vena cava and hepatic veins, a finding that potentially may indicate a degree of increased right heart pressure.  Aortic Atherosclerosis (ICD10-I70.0).  I have discussed with pulmonologist on-call Dr. Karna Christmas.  Patient will be going to the intensive care unit.   Emily Filbert, MD 12/15/19 [id number]

## 2019-12-15 NOTE — ED Notes (Signed)
Date and time results received: 12/15/19   Test: Troponin Critical Value: 109  Name of Provider Notified: Larinda Buttery Md

## 2019-12-16 ENCOUNTER — Inpatient Hospital Stay: Payer: 59

## 2019-12-16 ENCOUNTER — Inpatient Hospital Stay (HOSPITAL_COMMUNITY)
Admit: 2019-12-16 | Discharge: 2019-12-16 | Disposition: A | Discharge status: Home / Self Care | Payer: 59 | Attending: Pulmonary Disease | Admitting: Pulmonary Disease

## 2019-12-16 DIAGNOSIS — I5031 Acute diastolic (congestive) heart failure: Secondary | ICD-10-CM

## 2019-12-16 LAB — COMPREHENSIVE METABOLIC PANEL
ALT: 61 U/L — ABNORMAL HIGH (ref 0–44)
AST: 40 U/L (ref 15–41)
Albumin: 2.7 g/dL — ABNORMAL LOW (ref 3.5–5.0)
Alkaline Phosphatase: 46 U/L (ref 38–126)
Anion gap: 9 (ref 5–15)
BUN: 47 mg/dL — ABNORMAL HIGH (ref 8–23)
CO2: 23 mmol/L (ref 22–32)
Calcium: 8.3 mg/dL — ABNORMAL LOW (ref 8.9–10.3)
Chloride: 113 mmol/L — ABNORMAL HIGH (ref 98–111)
Creatinine, Ser: 1.33 mg/dL — ABNORMAL HIGH (ref 0.61–1.24)
GFR calc Af Amer: >60 mL/min (ref >60)
GFR calc non Af Amer: 56 mL/min — ABNORMAL LOW (ref >60)
Glucose, Bld: 186 mg/dL — ABNORMAL HIGH (ref 70–99)
Potassium: 4.3 mmol/L (ref 3.5–5.1)
Sodium: 145 mmol/L (ref 135–145)
Total Bilirubin: 1.1 mg/dL (ref 0.3–1.2)
Total Protein: 6.9 g/dL (ref 6.5–8.1)

## 2019-12-16 LAB — CBC
HCT: 41.1 % (ref 39.0–52.0)
Hemoglobin: 14.3 g/dL (ref 13.0–17.0)
MCH: 30.5 pg (ref 26.0–34.0)
MCHC: 34.8 g/dL (ref 30.0–36.0)
MCV: 87.6 fL (ref 80.0–100.0)
Platelets: 151 10*3/uL (ref 150–400)
RBC: 4.69 MIL/uL (ref 4.22–5.81)
RDW: 12.7 % (ref 11.5–15.5)
WBC: 7.6 10*3/uL (ref 4.0–10.5)
nRBC: 0 % (ref 0.0–0.2)

## 2019-12-16 LAB — BLOOD GAS, ARTERIAL
Acid-base deficit: 1.3 mmol/L (ref 0.0–2.0)
Bicarbonate: 22.7 mmol/L (ref 20.0–28.0)
Delivery systems: POSITIVE
Expiratory PAP: 10
FIO2: 1
Inspiratory PAP: 20
O2 Saturation: 93.4 %
Patient temperature: 37
pCO2 arterial: 35 mmHg (ref 32.0–48.0)
pH, Arterial: 7.42 (ref 7.350–7.450)
pO2, Arterial: 67 mmHg — ABNORMAL LOW (ref 83.0–108.0)

## 2019-12-16 LAB — PROCALCITONIN: Procalcitonin: 0.12 ng/mL

## 2019-12-16 LAB — PHOSPHORUS: Phosphorus: 3.5 mg/dL (ref 2.5–4.6)

## 2019-12-16 LAB — AMYLASE: Amylase: 301 U/L — ABNORMAL HIGH (ref 28–100)

## 2019-12-16 LAB — LIPASE, BLOOD: Lipase: 315 U/L — ABNORMAL HIGH (ref 11–51)

## 2019-12-16 LAB — ECHOCARDIOGRAM COMPLETE
Height: 65 in
Weight: 3283.2 oz

## 2019-12-16 LAB — SAR COV2 SEROLOGY (COVID19)AB(IGG),IA: SARS-CoV-2 Ab, IgG: REACTIVE — AB

## 2019-12-16 LAB — MAGNESIUM: Magnesium: 3.3 mg/dL — ABNORMAL HIGH (ref 1.7–2.4)

## 2019-12-16 LAB — CMV IGM: CMV IgM: <30 AU/mL (ref 0.0–29.9)

## 2019-12-16 MED ORDER — TRAZODONE HCL 50 MG PO TABS: 50.0000 mg | ORAL_TABLET | Freq: Every evening | ORAL | Status: DC | PRN

## 2019-12-16 MED ORDER — DEXAMETHASONE SODIUM PHOSPHATE 10 MG/ML IJ SOLN
6.0000 mg | INTRAMUSCULAR | Status: DC
  Administered 2019-12-16 – 2019-12-17 (×2): 6 mg via INTRAVENOUS
  Filled 2019-12-16 (×3): qty 0.6

## 2019-12-16 MED ORDER — ASCORBIC ACID 500 MG/ML IJ SOLN
250.0000 mg | Freq: Every day | INTRAMUSCULAR | Status: DC
  Administered 2019-12-17: 250 mg via INTRAVENOUS
  Filled 2019-12-16 (×4): qty 0.5

## 2019-12-16 MED ORDER — FUROSEMIDE 10 MG/ML IJ SOLN
40.0000 mg | Freq: Every day | INTRAMUSCULAR | Status: DC
  Administered 2019-12-16 – 2019-12-17 (×2): 40 mg via INTRAVENOUS
  Filled 2019-12-16 (×2): qty 4

## 2019-12-16 NOTE — Progress Notes (Signed)
CRITICAL CARE PROGRESS NOTE    Name: Bradley Chambers MRN: [id number] DOB: 02/10/1956     LOS: 1   SUBJECTIVE FINDINGS & SIGNIFICANT EVENTS   Patient description:  This is a 64 yo M with hx of lifelong smoking, reports feeling dyspneic with intermittent flu like symptoms "on and off", came in to ED with febrile illness found to be severely hypoxemic requiring BIPAP 100% to achieve spO2 89%.  He has normal BNP,,procal and wbc count, with elevation of ddimer and feritin which all points to COVID 19.  His COVID swab and rapid PCR were negative but overall ED doctor and myself still think high prob of COVID.  He has not been vaccinated in past and we have SARC-coV antibodies in process.   Lines / Drains: PIVx2  Cultures / Sepsis markers: Blood cultures COVID igG+   Antibiotics: Ivermectin  S/p 1x vanco and zosyn  5/18- admitted to MICU on BIPAP 100% 5/19- remains ventilator free on BIPAP, clinically slightly more comfortable but still with 100% fiO2 BIPAP.     PAST MEDICAL HISTORY   History reviewed. No pertinent past medical history.   SURGICAL HISTORY   Past Surgical History:  Procedure Laterality Date  . FINGER SURGERY  2005  . KNEE SURGERY  1990     FAMILY HISTORY   Family History  Problem Relation Age of Onset  . Heart disease Mother   . Healthy Sister   . Healthy Brother   . Cancer Father   . Hypertension Father      SOCIAL HISTORY   Social History   Tobacco Use  . Smoking status: Former Games developer  . Smokeless tobacco: Never Used  . Tobacco comment: QUIT IN 1994  Substance Use Topics  . Alcohol use: Yes    Alcohol/week: 0.0 standard drinks    Comment: OCCASIONALLY  . Drug use: No     MEDICATIONS   Current Medication:  Current Facility-Administered Medications:  .   ascorbic acid injection 250 mg, 250 mg, Intravenous, Daily, Charita Lindenberger, MD .  chlorhexidine (PERIDEX) 0.12 % solution 15 mL, 15 mL, Mouth Rinse, BID, Merisa Julio, MD .  chlorhexidine (PERIDEX) 0.12 % solution 15 mL, 15 mL, Mouth Rinse, BID, Karna Christmas, Jabe Jeanbaptiste, MD .  Chlorhexidine Gluconate Cloth 2 % PADS 6 each, 6 each, Topical, Daily, Vida Rigger, MD, 6 each at 12/15/19 1757 .  dexamethasone (DECADRON) injection 6 mg, 6 mg, Intravenous, Q24H, Harlon Ditty D, NP .  docusate sodium (COLACE) capsule 100 mg, 100 mg, Oral, BID PRN, Vida Rigger, MD .  enoxaparin (LOVENOX) injection 40 mg, 40 mg, Subcutaneous, Q24H, Karna Christmas, Theopolis Sloop, MD, 40 mg at 12/15/19 2238 .  famotidine (PEPCID) IVPB 20 mg premix, 20 mg, Intravenous, Q12H, Breckin Zafar, MD, Last Rate: 100 mL/hr at 12/15/19 2230, 20 mg at 12/15/19 2230 .  furosemide (LASIX) injection 40 mg, 40 mg, Intravenous, Daily, Keelen Quevedo, MD .  ivermectin (STROMECTOL) tablet 19,500 mcg, 200 mcg/kg (Order-Specific), Oral, Daily, Karna Christmas, Camesha Farooq, MD .  MEDLINE mouth rinse, 15 mL, Mouth Rinse, q12n4p, Elbony Mcclimans, MD .  MEDLINE mouth rinse, 15 mL, Mouth Rinse, q12n4p, Mercedes Fort, MD .  polyethylene glycol (MIRALAX / GLYCOLAX) packet 17 g, 17 g, Oral, Daily PRN, Vida Rigger, MD    ALLERGIES   Patient has no known allergies.    REVIEW OF SYSTEMS   10 point ROS done and is neg except as per HPI  PHYSICAL EXAMINATION   Vital Signs: Temp:  [97.1 F (36.2 C)-102.5  F (39.2 C)] 97.1 F (36.2 C) (05/18 1735) Pulse Rate:  [58-100] 61 (05/19 0800) Resp:  [15-45] 21 (05/19 0800) BP: (90-125)/(53-99) 111/71 (05/19 0800) SpO2:  [78 %-97 %] 92 % (05/19 0814) FiO2 (%):  [100 %] 100 % (05/18 1300) Weight:  [93.1 kg] 93.1 kg (05/18 1054)  GENERAL acutely distressed due to hypoxemia HEAD: Normocephalic, atraumatic.  EYES: Pupils equal, round, reactive to light.  No scleral icterus.  MOUTH: Moist mucosal membrane. NECK:  Supple. No thyromegaly. No nodules. No JVD.  PULMONARY: rhochi bilaterally CARDIOVASCULAR: S1 and S2. Regular rate and rhythm. No murmurs, rubs, or gallops.  GASTROINTESTINAL: Soft, nontender, non-distended. No masses. Positive bowel sounds. No hepatosplenomegaly.  MUSCULOSKELETAL: No swelling, clubbing, or edema.  NEUROLOGIC: Mild distress due to acute illness SKIN:intact,warm,dry   PERTINENT DATA     Infusions: . famotidine (PEPCID) IV 20 mg (12/15/19 2230)   Scheduled Medications: . ascorbic acid  250 mg Intravenous Daily  . chlorhexidine  15 mL Mouth Rinse BID  . chlorhexidine  15 mL Mouth Rinse BID  . Chlorhexidine Gluconate Cloth  6 each Topical Daily  . dexamethasone (DECADRON) injection  6 mg Intravenous Q24H  . enoxaparin (LOVENOX) injection  40 mg Subcutaneous Q24H  . furosemide  40 mg Intravenous Daily  . ivermectin  200 mcg/kg (Order-Specific) Oral Daily  . mouth rinse  15 mL Mouth Rinse q12n4p  . mouth rinse  15 mL Mouth Rinse q12n4p   PRN Medications:  Hemodynamic parameters:   Intake/Output: 05/18 0701 - 05/19 0700 In: 1000 [I.V.:1000] Out: 1425 [Urine:1425]  Ventilator  Settings: FiO2 (%):  [100 %] 100 %    LAB RESULTS:  Basic Metabolic Panel: Recent Labs  Lab 12/15/19 1100 12/16/19 0421  NA 140 145  K 3.7 4.3  CL 108 113*  CO2 21* 23  GLUCOSE 174* 186*  BUN 60* 47*  CREATININE 1.92* 1.33*  CALCIUM 7.6* 8.3*  MG  --  3.3*  PHOS  --  3.5   Liver Function Tests: Recent Labs  Lab 12/15/19 1100 12/16/19 0421  AST 66* 40  ALT 68* 61*  ALKPHOS 42 46  BILITOT 1.2 1.1  PROT 7.1 6.9  ALBUMIN 2.9* 2.7*   Recent Labs  Lab 12/15/19 1821 12/16/19 0421  LIPASE 137* 315*  AMYLASE 142* 301*   No results for input(s): AMMONIA in the last 168 hours. CBC: Recent Labs  Lab 12/15/19 1100 12/16/19 0421  WBC 6.3 7.6  NEUTROABS 5.0  --   HGB 14.3 14.3  HCT 41.7 41.1  MCV 89.3 87.6  PLT 173 151   Cardiac Enzymes: No results for  input(s): CKTOTAL, CKMB, CKMBINDEX, TROPONINI in the last 168 hours. BNP: Invalid input(s): POCBNP CBG: Recent Labs  Lab 12/15/19 1735  GLUCAP 209*       IMAGING RESULTS:  Imaging: CT Angio Chest PE W and/or Wo Contrast  Result Date: 12/15/2019 CLINICAL DATA:  Shortness of breath.  COVID-19 positive EXAM: CT ANGIOGRAPHY CHEST WITH CONTRAST TECHNIQUE: Multidetector CT imaging of the chest was performed using the standard protocol during bolus administration of intravenous contrast. Multiplanar CT image reconstructions and MIPs were obtained to evaluate the vascular anatomy. CONTRAST:  96mL OMNIPAQUE IOHEXOL 300 MG/ML  SOLN COMPARISON:  Chest radiograph Dec 15, 2019 FINDINGS: Cardiovascular: There is no demonstrable pulmonary embolus. There is no appreciable thoracic aortic aneurysm or dissection. There is slight calcification at the origin of the left common carotid artery. Other visualized great vessels appear unremarkable. There is slight aortic  atherosclerosis. There are occasional foci of coronary artery calcification. There is no pericardial effusion or pericardial thickening. Mediastinum/Nodes: Visualized thyroid appears unremarkable. There are multiple subcentimeter mediastinal lymph nodes. There is a subcarinal lymph node measuring 1.3 x 1.3 cm. There is a lymph node in the superior left hilum measuring 1.0 x 1.0 cm. There is a small hiatal hernia. Lungs/Pleura: There is extensive airspace opacity throughout the lungs diffusely, with involvement to varying degrees in essentially all lobes in segments except for sparing of the apical segment of the right upper lobe. There is slight consolidation in the lower lobes. Most areas have a more ill-defined opacity. No pleural effusions evident. Upper Abdomen: There is a degree of reflux into the inferior vena cava and slight reflux into hepatic veins. Visualized upper abdominal structures otherwise appear unremarkable. Musculoskeletal: There is  degenerative change in the thoracic spine. No blastic or lytic bone lesions. No evident chest wall lesions. Review of the MIP images confirms the above findings. IMPRESSION: 1. No demonstrable pulmonary embolus. No thoracic aortic aneurysm or dissection. There is aortic atherosclerosis as well as occasional foci of coronary artery calcification. 2. Widespread airspace opacity bilaterally consistent with widespread multifocal pneumonia. Given the clinical history, suspect atypical organism pneumonia. 3. Mild adenopathy, likely of reactive etiology given the extensive parenchymal lung abnormalities. 4.  Small hiatal hernia. 5. There is mild reflux into the inferior vena cava and hepatic veins, a finding that potentially may indicate a degree of increased right heart pressure. Aortic Atherosclerosis (ICD10-I70.0). Electronically Signed   By: Bretta Bang III M.D.   On: 12/15/2019 13:49   DG Chest Port 1 View  Result Date: 12/16/2019 CLINICAL DATA:  COVID positive EXAM: PORTABLE CHEST 1 VIEW COMPARISON:  Yesterday FINDINGS: Patchy bilateral pulmonary infiltrate that is unchanged. No pleural effusion or pneumothorax. Cardiomegaly that is chronic. No pneumothorax. IMPRESSION: Stable multifocal pneumonia. Electronically Signed   By: Marnee Spring M.D.   On: 12/16/2019 05:52   DG Chest Port 1 View  Result Date: 12/15/2019 CLINICAL DATA:  Dyspnea. EXAM: PORTABLE CHEST 1 VIEW COMPARISON:  Report dated 05/25/2011. FINDINGS: Enlarged cardiac silhouette. Marked patchy airspace opacity in both lungs with some sparing at the lung bases and left lung apex. Minimal bilateral pleural effusions. Thoracic spine degenerative changes and mild scoliosis. IMPRESSION: 1. Marked bilateral pneumonia or alveolar edema. 2. Minimal bilateral pleural effusions. 3. Cardiomegaly. Electronically Signed   By: Beckie Salts M.D.   On: 12/15/2019 11:23   @PROBHOSP @ CT Angio Chest PE W and/or Wo Contrast  Result Date:  12/15/2019 CLINICAL DATA:  Shortness of breath.  COVID-19 positive EXAM: CT ANGIOGRAPHY CHEST WITH CONTRAST TECHNIQUE: Multidetector CT imaging of the chest was performed using the standard protocol during bolus administration of intravenous contrast. Multiplanar CT image reconstructions and MIPs were obtained to evaluate the vascular anatomy. CONTRAST:  54mL OMNIPAQUE IOHEXOL 300 MG/ML  SOLN COMPARISON:  Chest radiograph Dec 15, 2019 FINDINGS: Cardiovascular: There is no demonstrable pulmonary embolus. There is no appreciable thoracic aortic aneurysm or dissection. There is slight calcification at the origin of the left common carotid artery. Other visualized great vessels appear unremarkable. There is slight aortic atherosclerosis. There are occasional foci of coronary artery calcification. There is no pericardial effusion or pericardial thickening. Mediastinum/Nodes: Visualized thyroid appears unremarkable. There are multiple subcentimeter mediastinal lymph nodes. There is a subcarinal lymph node measuring 1.3 x 1.3 cm. There is a lymph node in the superior left hilum measuring 1.0 x 1.0 cm. There is a small  hiatal hernia. Lungs/Pleura: There is extensive airspace opacity throughout the lungs diffusely, with involvement to varying degrees in essentially all lobes in segments except for sparing of the apical segment of the right upper lobe. There is slight consolidation in the lower lobes. Most areas have a more ill-defined opacity. No pleural effusions evident. Upper Abdomen: There is a degree of reflux into the inferior vena cava and slight reflux into hepatic veins. Visualized upper abdominal structures otherwise appear unremarkable. Musculoskeletal: There is degenerative change in the thoracic spine. No blastic or lytic bone lesions. No evident chest wall lesions. Review of the MIP images confirms the above findings. IMPRESSION: 1. No demonstrable pulmonary embolus. No thoracic aortic aneurysm or dissection.  There is aortic atherosclerosis as well as occasional foci of coronary artery calcification. 2. Widespread airspace opacity bilaterally consistent with widespread multifocal pneumonia. Given the clinical history, suspect atypical organism pneumonia. 3. Mild adenopathy, likely of reactive etiology given the extensive parenchymal lung abnormalities. 4.  Small hiatal hernia. 5. There is mild reflux into the inferior vena cava and hepatic veins, a finding that potentially may indicate a degree of increased right heart pressure. Aortic Atherosclerosis (ICD10-I70.0). Electronically Signed   By: Bretta Bang III M.D.   On: 12/15/2019 13:49   DG Chest Port 1 View  Result Date: 12/16/2019 CLINICAL DATA:  COVID positive EXAM: PORTABLE CHEST 1 VIEW COMPARISON:  Yesterday FINDINGS: Patchy bilateral pulmonary infiltrate that is unchanged. No pleural effusion or pneumothorax. Cardiomegaly that is chronic. No pneumothorax. IMPRESSION: Stable multifocal pneumonia. Electronically Signed   By: Marnee Spring M.D.   On: 12/16/2019 05:52   DG Chest Port 1 View  Result Date: 12/15/2019 CLINICAL DATA:  Dyspnea. EXAM: PORTABLE CHEST 1 VIEW COMPARISON:  Report dated 05/25/2011. FINDINGS: Enlarged cardiac silhouette. Marked patchy airspace opacity in both lungs with some sparing at the lung bases and left lung apex. Minimal bilateral pleural effusions. Thoracic spine degenerative changes and mild scoliosis. IMPRESSION: 1. Marked bilateral pneumonia or alveolar edema. 2. Minimal bilateral pleural effusions. 3. Cardiomegaly. Electronically Signed   By: Beckie Salts M.D.   On: 12/15/2019 11:23        ASSESSMENT AND PLAN    -Multidisciplinary rounds held today  Acute Hypoxic Respiratory Failure -continue NIV BIPAP support -high risk for intubation -SARS-coV antibody testing + in patient who is unvaccinated indicative of active COVID19 - patient shares he has been sick for over 3 weeks hence nasopharyngeal swab was  negative -respiratory and blood cultures -continue ivermectin -will discuss efficacy of remdesevir at this point -27month out -legionella and strep pmeunomia urine antigen -lipase/amylase to R/O panc associated ards -continue Bronchodilator Therapy -TTE done today 12/16/19 to rule out CHF - note BNP is 88   Acute Renal Failure stage 2-most likely due to ATN -follow chem 7 -follow UO -continue Foley Catheter-assess need daily   ID -continue IV abx as prescibed -follow up cultures  GI/Nutrition GI PROPHYLAXIS as indicated DIET-->TF's as tolerated Constipation protocol as indicated  ENDO - ICU hypoglycemic\Hyperglycemia protocol -check FSBS per protocol   ELECTROLYTES -follow labs as needed -replace as needed -pharmacy consultation   DVT/GI PRX ordered -SCDs  TRANSFUSIONS AS NEEDED MONITOR FSBS ASSESS the need for LABS as needed   Critical care provider statement:    Critical care time (minutes):  33   Critical care time was exclusive of:  Separately billable procedures and treating other patients   Critical care was necessary to treat or prevent imminent or life-threatening deterioration of the  following conditions:  acute hypoxemic respiratory failure, acute kidney injury , multipple comorbid coditions   Critical care was time spent personally by me on the following activities:  Development of treatment plan with patient or surrogate, discussions with consultants, evaluation of patient's response to treatment, examination of patient, obtaining history from patient or surrogate, ordering and performing treatments and interventions, ordering and review of laboratory studies and re-evaluation of patient's condition.  I assumed direction of critical care for this patient from another provider in my specialty: no    This document was prepared using Dragon voice recognition software and may include unintentional dictation errors.    Vida RiggerFuad Aaliyah Gavel, M.D.  Division of  Pulmonary & Critical Care Medicine  [facility]

## 2019-12-16 NOTE — Progress Notes (Signed)
Patient alert, no complaints of pain. Titrated off of bi-pap to HFNC. Orders to maintain patients oxygenation 85% and above. Lasix given. Good urine output. Continue to monitor.

## 2019-12-16 NOTE — Progress Notes (Signed)
*  PRELIMINARY RESULTS* Echocardiogram 2D Echocardiogram has been performed.  Bradley Chambers 12/16/2019, 8:56 AM

## 2019-12-16 NOTE — Progress Notes (Signed)
CSW received a call from [facility] Case Manager Darl Pikes who reported she was calling to see if there are any discharge planning needs/updates regarding this patient. She provided her direct # in case any assistance is needed with discharge planning for this patient.   Darl Pikes [address] LCSW [phone]

## 2019-12-17 ENCOUNTER — Inpatient Hospital Stay: Payer: 59

## 2019-12-17 ENCOUNTER — Inpatient Hospital Stay (HOSPITAL_COMMUNITY)
Admission: AD | Admit: 2019-12-17 | Discharge: 2020-01-28 | DRG: 870 | Disposition: E | Payer: 59 | Source: Other Acute Inpatient Hospital | Attending: Internal Medicine | Admitting: Internal Medicine

## 2019-12-17 DIAGNOSIS — Z66 Do not resuscitate: Secondary | ICD-10-CM | POA: Diagnosis not present

## 2019-12-17 DIAGNOSIS — D6959 Other secondary thrombocytopenia: Secondary | ICD-10-CM | POA: Diagnosis not present

## 2019-12-17 DIAGNOSIS — R339 Retention of urine, unspecified: Secondary | ICD-10-CM | POA: Diagnosis not present

## 2019-12-17 DIAGNOSIS — Z8249 Family history of ischemic heart disease and other diseases of the circulatory system: Secondary | ICD-10-CM

## 2019-12-17 DIAGNOSIS — J1282 Pneumonia due to coronavirus disease 2019: Secondary | ICD-10-CM | POA: Diagnosis present

## 2019-12-17 DIAGNOSIS — R6521 Severe sepsis with septic shock: Secondary | ICD-10-CM | POA: Diagnosis not present

## 2019-12-17 DIAGNOSIS — I10 Essential (primary) hypertension: Secondary | ICD-10-CM | POA: Diagnosis present

## 2019-12-17 DIAGNOSIS — Z7982 Long term (current) use of aspirin: Secondary | ICD-10-CM

## 2019-12-17 DIAGNOSIS — J9622 Acute and chronic respiratory failure with hypercapnia: Secondary | ICD-10-CM | POA: Diagnosis not present

## 2019-12-17 DIAGNOSIS — E870 Hyperosmolality and hypernatremia: Secondary | ICD-10-CM | POA: Diagnosis not present

## 2019-12-17 DIAGNOSIS — J9621 Acute and chronic respiratory failure with hypoxia: Secondary | ICD-10-CM

## 2019-12-17 DIAGNOSIS — E669 Obesity, unspecified: Secondary | ICD-10-CM | POA: Diagnosis present

## 2019-12-17 DIAGNOSIS — R748 Abnormal levels of other serum enzymes: Secondary | ICD-10-CM | POA: Diagnosis present

## 2019-12-17 DIAGNOSIS — E875 Hyperkalemia: Secondary | ICD-10-CM | POA: Diagnosis present

## 2019-12-17 DIAGNOSIS — R34 Anuria and oliguria: Secondary | ICD-10-CM | POA: Diagnosis not present

## 2019-12-17 DIAGNOSIS — A4189 Other specified sepsis: Principal | ICD-10-CM | POA: Diagnosis present

## 2019-12-17 DIAGNOSIS — J9601 Acute respiratory failure with hypoxia: Secondary | ICD-10-CM | POA: Diagnosis not present

## 2019-12-17 DIAGNOSIS — E1165 Type 2 diabetes mellitus with hyperglycemia: Secondary | ICD-10-CM | POA: Diagnosis present

## 2019-12-17 DIAGNOSIS — Z6833 Body mass index (BMI) 33.0-33.9, adult: Secondary | ICD-10-CM

## 2019-12-17 DIAGNOSIS — N179 Acute kidney failure, unspecified: Secondary | ICD-10-CM | POA: Diagnosis present

## 2019-12-17 DIAGNOSIS — E785 Hyperlipidemia, unspecified: Secondary | ICD-10-CM | POA: Diagnosis present

## 2019-12-17 DIAGNOSIS — J9600 Acute respiratory failure, unspecified whether with hypoxia or hypercapnia: Secondary | ICD-10-CM

## 2019-12-17 DIAGNOSIS — Z79899 Other long term (current) drug therapy: Secondary | ICD-10-CM

## 2019-12-17 DIAGNOSIS — Z87891 Personal history of nicotine dependence: Secondary | ICD-10-CM | POA: Diagnosis not present

## 2019-12-17 DIAGNOSIS — U071 COVID-19: Secondary | ICD-10-CM | POA: Diagnosis present

## 2019-12-17 DIAGNOSIS — J80 Acute respiratory distress syndrome: Secondary | ICD-10-CM | POA: Diagnosis present

## 2019-12-17 LAB — POCT I-STAT 7, (LYTES, BLD GAS, ICA,H+H)
Acid-base deficit: 3 mmol/L — ABNORMAL HIGH (ref 0.0–2.0)
Bicarbonate: 24.8 mmol/L (ref 20.0–28.0)
Calcium, Ion: 1.07 mmol/L — ABNORMAL LOW (ref 1.15–1.40)
HCT: 41 % (ref 39.0–52.0)
Hemoglobin: 13.9 g/dL (ref 13.0–17.0)
O2 Saturation: 97 %
Patient temperature: 99.5
Potassium: 5.8 mmol/L — ABNORMAL HIGH (ref 3.5–5.1)
Sodium: 142 mmol/L (ref 135–145)
TCO2: 26 mmol/L (ref 22–32)
pCO2 arterial: 53.9 mmHg — ABNORMAL HIGH (ref 32.0–48.0)
pH, Arterial: 7.274 — ABNORMAL LOW (ref 7.350–7.450)
pO2, Arterial: 107 mmHg (ref 83.0–108.0)

## 2019-12-17 LAB — RESPIRATORY PANEL BY PCR

## 2019-12-17 LAB — BASIC METABOLIC PANEL
Anion gap: 15 (ref 5–15)
BUN: 49 mg/dL — ABNORMAL HIGH (ref 8–23)
CO2: 25 mmol/L (ref 22–32)
Calcium: 7.3 mg/dL — ABNORMAL LOW (ref 8.9–10.3)
Chloride: 105 mmol/L (ref 98–111)
Creatinine, Ser: 2 mg/dL — ABNORMAL HIGH (ref 0.61–1.24)
GFR calc Af Amer: 40 mL/min — ABNORMAL LOW (ref >60)
GFR calc non Af Amer: 34 mL/min — ABNORMAL LOW (ref >60)
Glucose, Bld: 264 mg/dL — ABNORMAL HIGH (ref 70–99)
Potassium: 6 mmol/L — ABNORMAL HIGH (ref 3.5–5.1)
Sodium: 145 mmol/L (ref 135–145)

## 2019-12-17 LAB — COMPREHENSIVE METABOLIC PANEL
ALT: 49 U/L — ABNORMAL HIGH (ref 0–44)
AST: 42 U/L — ABNORMAL HIGH (ref 15–41)
Albumin: 2.6 g/dL — ABNORMAL LOW (ref 3.5–5.0)
Alkaline Phosphatase: 70 U/L (ref 38–126)
Anion gap: 14 (ref 5–15)
BUN: 47 mg/dL — ABNORMAL HIGH (ref 8–23)
CO2: 25 mmol/L (ref 22–32)
Calcium: 7.6 mg/dL — ABNORMAL LOW (ref 8.9–10.3)
Chloride: 106 mmol/L (ref 98–111)
Creatinine, Ser: 2.06 mg/dL — ABNORMAL HIGH (ref 0.61–1.24)
GFR calc Af Amer: 38 mL/min — ABNORMAL LOW (ref >60)
GFR calc non Af Amer: 33 mL/min — ABNORMAL LOW (ref >60)
Glucose, Bld: 279 mg/dL — ABNORMAL HIGH (ref 70–99)
Potassium: 6.7 mmol/L (ref 3.5–5.1)
Sodium: 145 mmol/L (ref 135–145)
Total Bilirubin: 1.6 mg/dL — ABNORMAL HIGH (ref 0.3–1.2)
Total Protein: 6.8 g/dL (ref 6.5–8.1)

## 2019-12-17 LAB — BLOOD GAS, ARTERIAL
Acid-base deficit: 4 mmol/L — ABNORMAL HIGH (ref 0.0–2.0)
Bicarbonate: 25.6 mmol/L (ref 20.0–28.0)
FIO2: 1
MECHVT: 500 mL
O2 Saturation: 99.1 %
PEEP: 20 cmH2O
Patient temperature: 37
RATE: 18 resp/min
pCO2 arterial: 67 mmHg (ref 32.0–48.0)
pH, Arterial: 7.19 — CL (ref 7.350–7.450)
pO2, Arterial: 162 mmHg — ABNORMAL HIGH (ref 83.0–108.0)

## 2019-12-17 LAB — BODY FLUID CELL COUNT WITH DIFFERENTIAL
Eos, Fluid: 0 %
Lymphs, Fluid: 14 %
Monocyte-Macrophage-Serous Fluid: 35 % — ABNORMAL LOW (ref 50–90)
Neutrophil Count, Fluid: 51 % — ABNORMAL HIGH (ref 0–25)
Total Nucleated Cell Count, Fluid: 36 cu mm (ref 0–1000)

## 2019-12-17 LAB — CBC
HCT: 45.8 % (ref 39.0–52.0)
Hemoglobin: 14.5 g/dL (ref 13.0–17.0)
MCH: 30.1 pg (ref 26.0–34.0)
MCHC: 31.7 g/dL (ref 30.0–36.0)
MCV: 95 fL (ref 80.0–100.0)
Platelets: 181 10*3/uL (ref 150–400)
RBC: 4.82 MIL/uL (ref 4.22–5.81)
RDW: 12.9 % (ref 11.5–15.5)
WBC: 16.6 10*3/uL — ABNORMAL HIGH (ref 4.0–10.5)
nRBC: 0.5 % — ABNORMAL HIGH (ref 0.0–0.2)

## 2019-12-17 LAB — TRIGLYCERIDES
Triglycerides: 226 mg/dL — ABNORMAL HIGH (ref <150)
Triglycerides: 255 mg/dL — ABNORMAL HIGH (ref <150)

## 2019-12-17 LAB — SARS CORONAVIRUS 2 BY RT PCR (HOSPITAL ORDER, PERFORMED IN ~~LOC~~ HOSPITAL LAB): SARS Coronavirus 2: POSITIVE — AB

## 2019-12-17 LAB — AMYLASE: Amylase: 61 U/L (ref 28–100)

## 2019-12-17 LAB — LACTIC ACID, PLASMA
Lactic Acid, Venous: 1.6 mmol/L (ref 0.5–1.9)
Lactic Acid, Venous: 1.4 mmol/L (ref 0.5–1.9)

## 2019-12-17 LAB — RSV(RESPIRATORY SYNCYTIAL VIRUS) AB, BLOOD: RSV Ab: NEGATIVE

## 2019-12-17 LAB — BRAIN NATRIURETIC PEPTIDE: B Natriuretic Peptide: 99.2 pg/mL (ref 0.0–100.0)

## 2019-12-17 LAB — PROCALCITONIN
Procalcitonin: 0.26 ng/mL
Procalcitonin: 1.84 ng/mL

## 2019-12-17 LAB — EPSTEIN-BARR VIRUS VCA, IGG: EBV VCA IgG: 446 U/mL — ABNORMAL HIGH (ref 0.0–17.9)

## 2019-12-17 LAB — GLUCOSE, CAPILLARY
Glucose-Capillary: 269 mg/dL — ABNORMAL HIGH (ref 70–99)
Glucose-Capillary: 267 mg/dL — ABNORMAL HIGH (ref 70–99)

## 2019-12-17 LAB — EPSTEIN-BARR VIRUS VCA, IGM: EBV VCA IgM: <36 U/mL (ref 0.0–35.9)

## 2019-12-17 LAB — LIPASE, BLOOD: Lipase: 42 U/L (ref 11–51)

## 2019-12-17 LAB — PHOSPHORUS: Phosphorus: 9 mg/dL — ABNORMAL HIGH (ref 2.5–4.6)

## 2019-12-17 LAB — MAGNESIUM: Magnesium: 3 mg/dL — ABNORMAL HIGH (ref 1.7–2.4)

## 2019-12-17 MED ORDER — ETOMIDATE 2 MG/ML IV SOLN: 20.0000 mg | INTRAVENOUS | Status: AC

## 2019-12-17 MED ORDER — PIPERACILLIN-TAZOBACTAM 3.375 G IVPB
3.3750 g | Freq: Three times a day (TID) | INTRAVENOUS | Status: DC
  Administered 2019-12-18: 3.375 g via INTRAVENOUS
  Filled 2019-12-17 (×2): qty 50

## 2019-12-17 MED ORDER — DOCUSATE SODIUM 50 MG/5ML PO LIQD
100.0000 mg | Freq: Two times a day (BID) | ORAL | Status: DC
  Administered 2019-12-17 – 2019-12-23 (×11): 100 mg via ORAL
  Filled 2019-12-17 (×11): qty 10

## 2019-12-17 MED ORDER — ETOMIDATE 2 MG/ML IV SOLN
INTRAVENOUS | Status: AC
  Administered 2019-12-17: 20 mg via INTRAVENOUS
  Filled 2019-12-17: qty 10

## 2019-12-17 MED ORDER — MIDAZOLAM HCL 2 MG/2ML IJ SOLN: 4.0000 mg | INTRAMUSCULAR | Status: AC

## 2019-12-17 MED ORDER — PROPOFOL 1000 MG/100ML IV EMUL: 5.0000 ug/kg/min | INTRAVENOUS | Status: DC

## 2019-12-17 MED ORDER — SODIUM CHLORIDE 0.9 % IV SOLN
100.0000 mg | Freq: Every day | INTRAVENOUS | Status: AC
  Administered 2019-12-18 – 2019-12-21 (×4): 100 mg via INTRAVENOUS
  Filled 2019-12-17 (×4): qty 20

## 2019-12-17 MED ORDER — FREE WATER
200.0000 mL | Status: DC
  Administered 2019-12-17 – 2019-12-18 (×3): 200 mL

## 2019-12-17 MED ORDER — FENTANYL BOLUS VIA INFUSION
50.0000 ug | INTRAVENOUS | Status: DC | PRN
  Filled 2019-12-17: qty 50

## 2019-12-17 MED ORDER — VANCOMYCIN HCL 1750 MG/350ML IV SOLN
1750.0000 mg | Freq: Once | INTRAVENOUS | Status: AC
  Administered 2019-12-17: 1750 mg via INTRAVENOUS
  Filled 2019-12-17: qty 350

## 2019-12-17 MED ORDER — ROCURONIUM BROMIDE 50 MG/5ML IV SOLN
INTRAVENOUS | Status: AC
  Administered 2019-12-17: 40 mg via INTRAVENOUS
  Filled 2019-12-17: qty 1

## 2019-12-17 MED ORDER — PIPERACILLIN-TAZOBACTAM 3.375 G IVPB 30 MIN
3.3750 g | Freq: Once | INTRAVENOUS | Status: AC
  Administered 2019-12-17: 3.375 g via INTRAVENOUS
  Filled 2019-12-17: qty 50

## 2019-12-17 MED ORDER — ORAL CARE MOUTH RINSE
15.0000 mL | OROMUCOSAL | Status: DC
  Administered 2019-12-17 – 2020-01-02 (×153): 15 mL via OROMUCOSAL

## 2019-12-17 MED ORDER — HEPARIN SODIUM (PORCINE) 5000 UNIT/ML IJ SOLN
5000.0000 [IU] | Freq: Three times a day (TID) | INTRAMUSCULAR | Status: DC
  Administered 2019-12-17 – 2019-12-18 (×3): 5000 [IU] via SUBCUTANEOUS
  Filled 2019-12-17 (×3): qty 1

## 2019-12-17 MED ORDER — PROPOFOL 1000 MG/100ML IV EMUL
INTRAVENOUS | Status: AC
  Filled 2019-12-17: qty 100

## 2019-12-17 MED ORDER — ROCURONIUM BROMIDE 50 MG/5ML IV SOLN
50.0000 mg | INTRAVENOUS | Status: AC
  Administered 2019-12-17: 50 mg via INTRAVENOUS

## 2019-12-17 MED ORDER — PRO-STAT SUGAR FREE PO LIQD
30.0000 mL | Freq: Two times a day (BID) | ORAL | Status: DC
  Administered 2019-12-17 – 2020-01-02 (×32): 30 mL
  Filled 2019-12-17 (×32): qty 30

## 2019-12-17 MED ORDER — FENTANYL 2500MCG IN NS 250ML (10MCG/ML) PREMIX INFUSION
50.0000 ug/h | INTRAVENOUS | Status: DC
  Filled 2019-12-17 (×2): qty 250

## 2019-12-17 MED ORDER — FENTANYL CITRATE (PF) 100 MCG/2ML IJ SOLN
INTRAMUSCULAR | Status: AC
  Administered 2019-12-17: 100 ug via INTRAVENOUS
  Filled 2019-12-17: qty 2

## 2019-12-17 MED ORDER — FENTANYL 2500MCG IN NS 250ML (10MCG/ML) PREMIX INFUSION
0.0000 ug/h | INTRAVENOUS | Status: DC
  Administered 2019-12-17: 100 ug/h via INTRAVENOUS
  Filled 2019-12-17: qty 250

## 2019-12-17 MED ORDER — FENTANYL CITRATE (PF) 100 MCG/2ML IJ SOLN: 50.0000 ug | Freq: Once | INTRAMUSCULAR | Status: DC

## 2019-12-17 MED ORDER — FAMOTIDINE IN NACL 20-0.9 MG/50ML-% IV SOLN
20.0000 mg | Freq: Two times a day (BID) | INTRAVENOUS | Status: DC
  Administered 2019-12-17 – 2019-12-18 (×2): 20 mg via INTRAVENOUS
  Filled 2019-12-17 (×2): qty 50

## 2019-12-17 MED ORDER — FUROSEMIDE 10 MG/ML IJ SOLN
40.0000 mg | Freq: Once | INTRAMUSCULAR | Status: AC
  Administered 2019-12-17: 40 mg via INTRAVENOUS
  Filled 2019-12-17: qty 4

## 2019-12-17 MED ORDER — FENTANYL 2500MCG IN NS 250ML (10MCG/ML) PREMIX INFUSION
0.0000 ug/h | INTRAVENOUS | Status: DC
  Administered 2019-12-17 – 2019-12-19 (×8): 400 ug/h via INTRAVENOUS
  Filled 2019-12-17 (×6): qty 250

## 2019-12-17 MED ORDER — FAMOTIDINE 20 MG PO TABS
20.0000 mg | ORAL_TABLET | Freq: Two times a day (BID) | ORAL | Status: DC
  Administered 2019-12-17: 20 mg via ORAL
  Filled 2019-12-17: qty 1

## 2019-12-17 MED ORDER — ROCURONIUM BROMIDE 50 MG/5ML IV SOLN: 40.0000 mg | INTRAVENOUS | Status: AC

## 2019-12-17 MED ORDER — INSULIN ASPART 100 UNIT/ML ~~LOC~~ SOLN: 0.0000 [IU] | Freq: Four times a day (QID) | SUBCUTANEOUS | Status: DC

## 2019-12-17 MED ORDER — SODIUM CHLORIDE 0.9 % IV SOLN
200.0000 mg | Freq: Once | INTRAVENOUS | Status: AC
  Administered 2019-12-17: 200 mg via INTRAVENOUS
  Filled 2019-12-17: qty 40

## 2019-12-17 MED ORDER — DOCUSATE SODIUM 100 MG PO CAPS: 100.0000 mg | ORAL_CAPSULE | Freq: Two times a day (BID) | ORAL | Status: DC | PRN

## 2019-12-17 MED ORDER — VANCOMYCIN HCL 1250 MG/250ML IV SOLN: 1250.0000 mg | INTRAVENOUS | Status: DC

## 2019-12-17 MED ORDER — MIDAZOLAM HCL 2 MG/2ML IJ SOLN: 2.0000 mg | INTRAMUSCULAR | Status: DC | PRN

## 2019-12-17 MED ORDER — VITAL HIGH PROTEIN PO LIQD
1000.0000 mL | ORAL | Status: DC
  Administered 2019-12-17: 1000 mL

## 2019-12-17 MED ORDER — PANTOPRAZOLE SODIUM 40 MG IV SOLR
40.0000 mg | INTRAVENOUS | Status: DC
  Administered 2019-12-17 – 2019-12-20 (×4): 40 mg via INTRAVENOUS
  Filled 2019-12-17 (×4): qty 40

## 2019-12-17 MED ORDER — PROPOFOL 1000 MG/100ML IV EMUL
5.0000 ug/kg/min | INTRAVENOUS | Status: DC
  Administered 2019-12-17: 5 ug/kg/min via INTRAVENOUS
  Filled 2019-12-17: qty 100

## 2019-12-17 MED ORDER — MIDAZOLAM HCL 2 MG/2ML IJ SOLN
INTRAMUSCULAR | Status: AC
  Administered 2019-12-17: 4 mg via INTRAVENOUS
  Filled 2019-12-17: qty 4

## 2019-12-17 MED ORDER — NOREPINEPHRINE 4 MG/250ML-% IV SOLN
0.0000 ug/min | INTRAVENOUS | Status: DC
  Administered 2019-12-17: 11 ug/min via INTRAVENOUS
  Administered 2019-12-18: 12 ug/min via INTRAVENOUS
  Administered 2019-12-18: 9 ug/min via INTRAVENOUS
  Administered 2019-12-18: 12 ug/min via INTRAVENOUS
  Administered 2019-12-18: 10 ug/min via INTRAVENOUS
  Administered 2019-12-19: 9 ug/min via INTRAVENOUS
  Filled 2019-12-17 (×7): qty 250

## 2019-12-17 MED ORDER — POLYETHYLENE GLYCOL 3350 17 G PO PACK: 17.0000 g | PACK | Freq: Every day | ORAL | Status: DC | PRN

## 2019-12-17 MED ORDER — PROPOFOL 1000 MG/100ML IV EMUL
0.0000 ug/kg/min | INTRAVENOUS | Status: DC
  Administered 2019-12-17 (×2): 15 ug/kg/min via INTRAVENOUS
  Administered 2019-12-18 (×3): 50 ug/kg/min via INTRAVENOUS
  Administered 2019-12-18: 8 ug/kg/min via INTRAVENOUS
  Administered 2019-12-19 (×2): 50 ug/kg/min via INTRAVENOUS
  Filled 2019-12-17 (×7): qty 100

## 2019-12-17 MED ORDER — POLYETHYLENE GLYCOL 3350 17 G PO PACK
17.0000 g | PACK | Freq: Every day | ORAL | Status: DC
  Administered 2019-12-18 – 2019-12-24 (×5): 17 g via ORAL
  Filled 2019-12-17 (×5): qty 1

## 2019-12-17 MED ORDER — CHLORHEXIDINE GLUCONATE 0.12% ORAL RINSE (MEDLINE KIT)
15.0000 mL | Freq: Two times a day (BID) | OROMUCOSAL | Status: DC
  Administered 2019-12-17 – 2020-01-02 (×32): 15 mL via OROMUCOSAL

## 2019-12-17 MED ORDER — FENTANYL CITRATE (PF) 100 MCG/2ML IJ SOLN: 100.0000 ug | INTRAMUSCULAR | Status: AC

## 2019-12-17 MED ORDER — DEXMEDETOMIDINE HCL IN NACL 400 MCG/100ML IV SOLN
0.4000 ug/kg/h | INTRAVENOUS | Status: DC
  Administered 2019-12-17: 0.4 ug/kg/h via INTRAVENOUS
  Filled 2019-12-17: qty 100

## 2019-12-17 MED ORDER — VANCOMYCIN HCL 750 MG/150ML IV SOLN
750.0000 mg | Freq: Two times a day (BID) | INTRAVENOUS | Status: DC
  Filled 2019-12-17: qty 150

## 2019-12-17 MED ORDER — DEXAMETHASONE SODIUM PHOSPHATE 10 MG/ML IJ SOLN
6.0000 mg | INTRAMUSCULAR | Status: AC
  Administered 2019-12-18 – 2019-12-26 (×10): 6 mg via INTRAVENOUS
  Filled 2019-12-17 (×3): qty 1
  Filled 2019-12-17: qty 0.6
  Filled 2019-12-17 (×7): qty 1

## 2019-12-17 MED ORDER — METOPROLOL TARTRATE 5 MG/5ML IV SOLN
2.5000 mg | Freq: Once | INTRAVENOUS | Status: AC
  Administered 2019-12-17: 2.5 mg via INTRAVENOUS
  Filled 2019-12-17: qty 5

## 2019-12-17 MED ORDER — MIDAZOLAM HCL 2 MG/2ML IJ SOLN
2.0000 mg | INTRAMUSCULAR | Status: DC | PRN
  Administered 2019-12-18: 2 mg via INTRAVENOUS
  Filled 2019-12-17: qty 2

## 2019-12-17 MED ORDER — ONDANSETRON HCL 4 MG/2ML IJ SOLN: 4.0000 mg | Freq: Four times a day (QID) | INTRAMUSCULAR | Status: DC | PRN

## 2019-12-17 NOTE — Progress Notes (Signed)
Patient desaturating into the 60's on BIPAP and HHFNC at 100% 50L.  Physician ordered intubation.  I called and left messages with Wife and Son. Daughter Bradley Chambers was informed over the phone and said she would tell other family.

## 2019-12-17 NOTE — Discharge Summary (Signed)
CRITICAL CARE TRANSFER NOTE    Name: LONNIE RETH MRN: [id number] DOB: 1955-09-28     LOS: 2   SUBJECTIVE FINDINGS & SIGNIFICANT EVENTS   Patient description:  This is a 64 yo M with hx of lifelong smoking, reports feeling dyspneic with intermittent flu like symptoms "on and off", came in to ED with febrile illness found to be severely hypoxemic requiring BIPAP 100% to achieve spO2 89%.  He has normal BNP,,procal and wbc count, with elevation of ddimer and feritin which all points to COVID 19.  His COVID swab and rapid PCR were negative but overall ED doctor and myself still think high prob of COVID.  He has not been vaccinated in past and we have SARC-coV antibodies in process.   Lines / Drains: PIVx2  Cultures / Sepsis markers: Blood cultures COVID igG+   Antibiotics: Ivermectin  S/p 1x vanco and zosyn  5/18- admitted to MICU on BIPAP 100% 5/19- remains ventilator free on BIPAP, clinically slightly more comfortable but still with 100% fiO2 BIPAP.   5/20- patient clinically more labored breathing today, we discussed mechanical ventilation today, patient stated he does not feel that he can continue on BIPAP/high flow and agreed to intubation.  Spoke to wife Thayer Headings and met with daughter in law in person who is employed here.    PAST MEDICAL HISTORY   History reviewed. No pertinent past medical history.   SURGICAL HISTORY   Past Surgical History:  Procedure Laterality Date  . FINGER SURGERY  2005  . KNEE SURGERY  1990     FAMILY HISTORY   Family History  Problem Relation Age of Onset  . Heart disease Mother   . Healthy Sister   . Healthy Brother   . Cancer Father   . Hypertension Father      SOCIAL HISTORY   Social History   Tobacco Use  . Smoking status: Former Research scientist (life sciences)  . Smokeless  tobacco: Never Used  . Tobacco comment: QUIT IN 1994  Substance Use Topics  . Alcohol use: Yes    Alcohol/week: 0.0 standard drinks    Comment: OCCASIONALLY  . Drug use: No     MEDICATIONS   Current Medication:  Current Facility-Administered Medications:  .  ascorbic acid injection 250 mg, 250 mg, Intravenous, Daily, Lanney Gins, Juelz Whittenberg, MD, 250 mg at 12/26/2019 0106 .  chlorhexidine (PERIDEX) 0.12 % solution 15 mL, 15 mL, Mouth Rinse, BID, Lanney Gins, Renata Gambino, MD, 15 mL at 12/07/2019 1106 .  Chlorhexidine Gluconate Cloth 2 % PADS 6 each, 6 each, Topical, Daily, Ottie Glazier, MD, 6 each at 12/22/2019 1605 .  dexamethasone (DECADRON) injection 6 mg, 6 mg, Intravenous, Q24H, Darel Hong D, NP, 6 mg at 12/02/2019 1107 .  dexmedetomidine (PRECEDEX) 400 MCG/100ML (4 mcg/mL) infusion, 0.4-1.2 mcg/kg/hr, Intravenous, Titrated, Ottie Glazier, MD, Stopped at 12/11/2019 1406 .  docusate sodium (COLACE) capsule 100 mg, 100 mg, Oral, BID PRN, Lanney Gins, Denora Wysocki, MD .  enoxaparin (LOVENOX) injection 40 mg, 40 mg, Subcutaneous, Q24H, Lanney Gins, Makeisha Jentsch, MD, 40 mg at 12/16/19 2148 .  famotidine (PEPCID) tablet 20 mg, 20 mg, Oral, BID, Dallie Piles, RPH, 20 mg at 12/01/2019 1106 .  fentaNYL 2513mg in NS 2521m(1059mml) infusion-PREMIX, 0-400 mcg/hr, Intravenous, Continuous, Quin Mathenia, MD, Last Rate: 40 mL/hr at 12/04/2019 1500, 400 mcg/hr at 12/14/2019 1500 .  furosemide (LASIX) injection 40 mg, 40 mg, Intravenous, Daily, AleLanney Ginsuad, MD, 40 mg at 12/10/2019 1106 .  insulin aspart (novoLOG) injection 0-9 Units, 0-9 Units, Subcutaneous,  Q6H, Dallie Piles, RPH .  MEDLINE mouth rinse, 15 mL, Mouth Rinse, q12n4p, Adali Pennings, MD, 15 mL at 12/10/2019 1117 .  polyethylene glycol (MIRALAX / GLYCOLAX) packet 17 g, 17 g, Oral, Daily PRN, Lanney Gins, Clarkson Rosselli, MD .  propofol (DIPRIVAN) 1000 MG/100ML infusion, 5-80 mcg/kg/min, Intravenous, Titrated, Jennesis Ramaswamy, MD, Last Rate: 10.19 mL/hr at 12/19/2019 1500, 20  mcg/kg/min at 12/05/2019 1500 .  traZODone (DESYREL) tablet 50 mg, 50 mg, Oral, QHS PRN, Bradly Bienenstock, NP    ALLERGIES   Patient has no known allergies.    REVIEW OF SYSTEMS   10 point ROS done and is neg except as per HPI  PHYSICAL EXAMINATION   Vital Signs: Temp:  [97.1 F (36.2 C)-99.5 F (37.5 C)] 99.5 F (37.5 C) (05/20 1225) Pulse Rate:  [62-118] 82 (05/20 1530) Resp:  [22-33] 24 (05/20 1530) BP: (92-186)/(54-130) 92/54 (05/20 1530) SpO2:  [73 %-100 %] 100 % (05/20 1530) FiO2 (%):  [60 %-100 %] 100 % (05/20 1529) Weight:  [84.9 kg] 84.9 kg (05/20 0108)  GENERAL Sedated on MV , age appropriate obese male HEAD: Normocephalic, atraumatic.  EYES: Pupils equal, round, reactive to light.  No scleral icterus.  MOUTH: Moist mucosal membrane. NECK: Supple. No thyromegaly. No nodules. No JVD.  PULMONARY: rhochi bilaterally CARDIOVASCULAR: S1 and S2. Regular rate and rhythm. No murmurs, rubs, or gallops.  GASTROINTESTINAL: Soft, nontender, non-distended. No masses. Positive bowel sounds. No hepatosplenomegaly.  MUSCULOSKELETAL: No swelling, clubbing, or edema.  NEUROLOGIC: Mild distress due to acute illness SKIN:intact,warm,dry   PERTINENT DATA     Infusions: . dexmedetomidine (PRECEDEX) IV infusion Stopped (12/23/2019 1406)  . fentaNYL infusion INTRAVENOUS 400 mcg/hr (12/13/2019 1500)  . propofol (DIPRIVAN) infusion 20 mcg/kg/min (12/28/2019 1500)   Scheduled Medications: . ascorbic acid  250 mg Intravenous Daily  . chlorhexidine  15 mL Mouth Rinse BID  . Chlorhexidine Gluconate Cloth  6 each Topical Daily  . dexamethasone (DECADRON) injection  6 mg Intravenous Q24H  . enoxaparin (LOVENOX) injection  40 mg Subcutaneous Q24H  . famotidine  20 mg Oral BID  . furosemide  40 mg Intravenous Daily  . insulin aspart  0-9 Units Subcutaneous Q6H  . mouth rinse  15 mL Mouth Rinse q12n4p   PRN Medications:  Hemodynamic parameters:   Intake/Output: 05/19 0701 - 05/20  0700 In: 150 [IV Piggyback:150] Out: 1900 [Urine:1900]  Ventilator  Settings: Vent Mode: PRVC FiO2 (%):  [60 %-100 %] 100 % Set Rate:  [22 bmp-24 bmp] 24 bmp Vt Set:  [500 mL] 500 mL PEEP:  [15 cmH20-16 cmH20] 15 cmH20    LAB RESULTS:  Basic Metabolic Panel: Recent Labs  Lab 12/15/19 1100 12/16/19 0421  NA 140 145  K 3.7 4.3  CL 108 113*  CO2 21* 23  GLUCOSE 174* 186*  BUN 60* 47*  CREATININE 1.92* 1.33*  CALCIUM 7.6* 8.3*  MG  --  3.3*  PHOS  --  3.5   Liver Function Tests: Recent Labs  Lab 12/15/19 1100 12/16/19 0421  AST 66* 40  ALT 68* 61*  ALKPHOS 42 46  BILITOT 1.2 1.1  PROT 7.1 6.9  ALBUMIN 2.9* 2.7*   Recent Labs  Lab 12/15/19 1821 12/16/19 0421  LIPASE 137* 315*  AMYLASE 142* 301*   No results for input(s): AMMONIA in the last 168 hours. CBC: Recent Labs  Lab 12/15/19 1100 12/16/19 0421  WBC 6.3 7.6  NEUTROABS 5.0  --   HGB 14.3 14.3  HCT 41.7 41.1  MCV 89.3 87.6  PLT 173 151   Cardiac Enzymes: No results for input(s): CKTOTAL, CKMB, CKMBINDEX, TROPONINI in the last 168 hours. BNP: Invalid input(s): POCBNP CBG: Recent Labs  Lab 12/15/19 1735  GLUCAP 209*       IMAGING RESULTS:  Imaging: DG Chest 1 View  Result Date: 12/06/2019 CLINICAL DATA:  Central line placement EXAM: CHEST  1 VIEW COMPARISON:  12/16/2019 FINDINGS: New right IJ central line tip overlies the SVC. New endotracheal tube is approximately 5 cm above the carina. Enteric tube passes below the diaphragm with tip out of field of view. No pneumothorax. Persistent bilateral pulmonary opacities with slight worsening of lung aeration. No significant pleural effusion. Normal heart size. IMPRESSION: Lines and tubes as above.  No pneumothorax. Slight worsening of bilateral pulmonary opacities since 12/16/2019. Electronically Signed   By: Macy Mis M.D.   On: 12/10/2019 15:02   DG Chest Port 1 View  Result Date: 12/16/2019 CLINICAL DATA:  COVID positive EXAM:  PORTABLE CHEST 1 VIEW COMPARISON:  Yesterday FINDINGS: Patchy bilateral pulmonary infiltrate that is unchanged. No pleural effusion or pneumothorax. Cardiomegaly that is chronic. No pneumothorax. IMPRESSION: Stable multifocal pneumonia. Electronically Signed   By: Monte Fantasia M.D.   On: 12/16/2019 05:52   ECHOCARDIOGRAM COMPLETE  Result Date: 12/16/2019    ECHOCARDIOGRAM REPORT   Patient Name:   MARTAVIUS LUSTY Date of Exam: 12/16/2019 Medical Rec #:  [id number]     Height:       65.0 in Accession #:    [id number]    Weight:       205.2 lb Date of Birth:  10-12-1955    BSA:          2.000 m Patient Age:    46 years      BP:           111/71 mmHg Patient Gender: M             HR:           61 bpm. Exam Location:  [facility] Procedure: 2D Echo, Color Doppler and Cardiac Doppler Indications:     I50.31 CHF-Acute Diastolic  History:         Patient has no prior history of Echocardiogram examinations. Pt                  tested positive for COVID-19 on 12/15/2019.  Sonographer:     Charmayne Sheer RDCS (AE) Referring Phys:  [id number] Ottie Glazier Diagnosing Phys: Kate Sable MD  Sonographer Comments: Suboptimal parasternal window. Image acquisition challenging due to patient body habitus. IMPRESSIONS  1. Left ventricular ejection fraction, by estimation, is 55 to 60%. The left ventricle has normal function. The left ventricle has no regional wall motion abnormalities. Left ventricular diastolic parameters were normal.  2. Right ventricular systolic function is normal. The right ventricular size is mildly enlarged. There is moderately elevated pulmonary artery systolic pressure.  3. Left atrial size was mildly dilated.  4. The mitral valve is normal in structure. No evidence of mitral valve regurgitation.  5. The aortic valve is grossly normal. Aortic valve regurgitation is not visualized.  6. The inferior vena cava is normal in size with greater than 50% respiratory variability, suggesting right atrial pressure of 3 mmHg.  FINDINGS  Left Ventricle: Left ventricular ejection fraction, by estimation, is 55 to 60%. The left ventricle has normal function. The left ventricle has no regional wall motion abnormalities. The left ventricular internal cavity size  was normal in size. There is  no left ventricular hypertrophy. Left ventricular diastolic parameters were normal. Right Ventricle: The right ventricular size is mildly enlarged. No increase in right ventricular wall thickness. Right ventricular systolic function is normal. There is moderately elevated pulmonary artery systolic pressure. The tricuspid regurgitant velocity is 3.27 m/s, and with an assumed right atrial pressure of 3 mmHg, the estimated right ventricular systolic pressure is 42.5 mmHg. Left Atrium: Left atrial size was mildly dilated. Right Atrium: Right atrial size was normal in size. Pericardium: There is no evidence of pericardial effusion. Mitral Valve: The mitral valve is normal in structure. No evidence of mitral valve regurgitation. MV peak gradient, 2.7 mmHg. The mean mitral valve gradient is 1.0 mmHg. Tricuspid Valve: The tricuspid valve is grossly normal. Tricuspid valve regurgitation is not demonstrated. Aortic Valve: The aortic valve is grossly normal. Aortic valve regurgitation is not visualized. Aortic valve mean gradient measures 4.0 mmHg. Aortic valve peak gradient measures 8.5 mmHg. Aortic valve area, by VTI measures 2.50 cm. Pulmonic Valve: The pulmonic valve was not well visualized. Pulmonic valve regurgitation is not visualized. Aorta: The aortic root is normal in size and structure. Venous: The inferior vena cava is normal in size with greater than 50% respiratory variability, suggesting right atrial pressure of 3 mmHg. IAS/Shunts: No atrial level shunt detected by color flow Doppler.  LEFT VENTRICLE PLAX 2D LVIDd:         4.32 cm  Diastology LVIDs:         2.86 cm  LV e' lateral:   6.96 cm/s LV PW:         1.14 cm  LV E/e' lateral: 10.0 LV IVS:         0.82 cm  LV e' medial:    6.74 cm/s LVOT diam:     2.10 cm  LV E/e' medial:  10.4 LV SV:         66 LV SV Index:   33 LVOT Area:     3.46 cm  RIGHT VENTRICLE RV Basal diam:  4.73 cm LEFT ATRIUM             Index       RIGHT ATRIUM           Index LA diam:        3.00 cm 1.50 cm/m  RA Area:     21.80 cm LA Vol (A2C):   46.4 ml 23.20 ml/m RA Volume:   75.90 ml  37.95 ml/m LA Vol (A4C):   34.1 ml 17.05 ml/m LA Biplane Vol: 41.8 ml 20.90 ml/m  AORTIC VALVE                   PULMONIC VALVE AV Area (Vmax):    2.35 cm    PV Vmax:       1.13 m/s AV Area (Vmean):   2.36 cm    PV Vmean:      62.800 cm/s AV Area (VTI):     2.50 cm    PV VTI:        0.163 m AV Vmax:           146.00 cm/s PV Peak grad:  5.1 mmHg AV Vmean:          92.000 cm/s PV Mean grad:  2.0 mmHg AV VTI:            0.265 m AV Peak Grad:      8.5 mmHg AV Mean Grad:  4.0 mmHg LVOT Vmax:         99.20 cm/s LVOT Vmean:        62.700 cm/s LVOT VTI:          0.191 m LVOT/AV VTI ratio: 0.72  AORTA Ao Root diam: 3.20 cm MITRAL VALVE               TRICUSPID VALVE MV Area (PHT): 3.20 cm    TR Peak grad:   42.8 mmHg MV Peak grad:  2.7 mmHg    TR Vmax:        327.00 cm/s MV Mean grad:  1.0 mmHg MV Vmax:       0.82 m/s    SHUNTS MV Vmean:      47.2 cm/s   Systemic VTI:  0.19 m MV Decel Time: 237 msec    Systemic Diam: 2.10 cm MV E velocity: 69.80 cm/s MV A velocity: 72.80 cm/s MV E/A ratio:  0.96 Kate Sable MD Electronically signed by Kate Sable MD Signature Date/Time: 12/16/2019/12:30:40 PM    Final    @PROBHOSP @ DG Chest 1 View  Result Date: 12/05/2019 CLINICAL DATA:  Central line placement EXAM: CHEST  1 VIEW COMPARISON:  12/16/2019 FINDINGS: New right IJ central line tip overlies the SVC. New endotracheal tube is approximately 5 cm above the carina. Enteric tube passes below the diaphragm with tip out of field of view. No pneumothorax. Persistent bilateral pulmonary opacities with slight worsening of lung aeration. No significant  pleural effusion. Normal heart size. IMPRESSION: Lines and tubes as above.  No pneumothorax. Slight worsening of bilateral pulmonary opacities since 12/16/2019. Electronically Signed   By: Macy Mis M.D.   On: 12/08/2019 15:02        ASSESSMENT AND PLAN    -Multidisciplinary rounds held today    Acute Hypoxic Respiratory Failure -patient on PRVC - ventilator management - currently on ARDS high PEEP ladder to maintain normoxia -patient was intubated-12/03/2019 -CVP trend -SARS-coV antibody testing + in patient who is unvaccinated indicative of COVID19 -as etiology of current hypoxemic respiratory failure. -patient did not receive any therapy for COVID while sick over past 1 month at home  - blood cultures in process-2D no growth -legionella and strep pmeunomia urine antigen ordered -lipase/amylase to R/O pancreatitis associated ARDS-mild elevation -continue Bronchodilator Therapy -TTE done today 12/16/19 to rule out CHF -systolic function is within reference range, possible pulmonary HTN may consider RHC on outpatient,  note BNP is 87   Acute Renal Failure stage 2-most likely due to ATN -follow chem 7 -follow UO -continue Foley Catheter-assess need daily -d/c nephrotoxic meds   Mild Protein calorie malnutrition  - nutritional RD dietary consultation   - OG tube + nourishement at initial 10/h with escalation to goal  ID -continue IV abx as prescibed -follow up cultures   GI/Nutrition GI PROPHYLAXIS as indicated DIET-->TF's as tolerated Constipation protocol as indicated    ENDO - ICU hypoglycemic\Hyperglycemia protocol -check FSBS per protocol   ELECTROLYTES -follow labs as needed -replace as needed -pharmacy consultation   DVT/GI PRX ordered -SCDs  TRANSFUSIONS AS NEEDED MONITOR FSBS ASSESS the need for LABS as needed   Critical care provider statement:    Critical care time (minutes):  33   Critical care time was exclusive of:  Separately billable  procedures and treating other patients   Critical care was necessary to treat or prevent imminent or life-threatening deterioration of the following conditions:  acute hypoxemic respiratory failure, acute kidney injury ,  multipple comorbid coditions   Critical care was time spent personally by me on the following activities:  Development of treatment plan with patient or surrogate, discussions with consultants, evaluation of patient's response to treatment, examination of patient, obtaining history from patient or surrogate, ordering and performing treatments and interventions, ordering and review of laboratory studies and re-evaluation of patient's condition.  I assumed direction of critical care for this patient from another provider in my specialty: no    This document was prepared using Dragon voice recognition software and may include unintentional dictation errors.    Ottie Glazier, M.D.  Division of [place]

## 2019-12-17 NOTE — Procedures (Signed)
Central Venous Catheter Placement: TRIPLE LUMEN  Indication: Patient receiving vesicant or irritant drug.; Patient receiving intravenous therapy for longer than 5 days.; Patient has limited or no vascular access.   Consent: from Wife Liborio Nixon in chart    Hand washing performed prior to starting the procedure.   Procedure:   An active timeout was performed and correct patient, name, & ID confirmed.   Patient was positioned correctly for central venous access.  Patient was prepped using strict sterile technique including chlorohexadine preps, sterile drape, sterile gown and sterile gloves.    The area was prepped, draped and anesthetized in the usual sterile manner. Patient comfort was obtained.    A triple lumen catheter was placed in Right Internal Jugular Vein There was good blood return, catheter caps were placed on lumens, catheter flushed easily, the line was secured and a sterile dressing and BIO-PATCH applied.   Ultrasound was used to visualize vasculature and guidance of needle.   Number of Attempts: 1 Complications:none Estimated Blood Loss: none Chest Radiograph indicated and ordered.      Vida Rigger, M.D.  Pulmonary & Critical Care Medicine  [facility]

## 2019-12-17 NOTE — H&P (Signed)
NAME:  Bradley Chambers, MRN:  [id number], DOB:  [birth year], LOS: 0 ADMISSION DATE:  12/08/2019, CONSULTATION DATE:  5/20 REFERRING MD:  Lanney Gins, CHIEF COMPLAINT:  Dyspnea   Brief History   64 y/o male admitted for severe acute respiratory failure with hypoxemia on 5/18, required intubation on 5/20.  He was initially treated for COVID.    History of present illness   This is a 64 y/o male with hypertension, hyperlipidemia and obesity presented 5/17 to the [facility] ED complaining of dyspnea with flu like symptoms.    Past Medical History  Hypertension Hyperlipidemia Obesity  Significant Hospital Events   5/18 came to [facility] ED, admitted to ICU off/on BIPAP, treated with remdesivir, decadron, ivermectin 5/20 intubated for severe acute respiratory failure with hypoxemia  Consults:  PCCM  Procedures:  5/20 ETT >  5/20 R IJ CVL >   Significant Diagnostic Tests:  5/18 CT angiogram chest > bilateral upper lobe predominant ground glass opacification and some patchy lower lobe consolidation, reactive lymphadenopathy, no PE 5/19 TTE> LVEF 55-60%, LV normal size/function, RV systolic function normal, RV size enlarged, moderately elevated PA pressure, left atrium mildly dilated, no mitral regurgitation 5/20 bronchoscopy >>>  Micro Data:  5/19 SARS CoV2 Ag > negative 5/19 SARS CoV2 PCR > negative 5/19 SARS CoV2 Ab > negative 5/18 Blood > negative 5/18 blood > negative 5/18 CMV IgM > negative 5/18 EBV IgG > elevated 5/18 EBV IgM > negative 5/18 HIV > negative 5/20 BAL bacterial culture >  5/20 BAL fungal culture >  5/20 BAL aspergillus antigen >  5/20 BAL PJP >  5/20 BAL respiratory viral panel 5/20 BAL SARS CoV 2 >  5/20 Nasal SARS CoV 2 >  5/20 serum fungitel>  5/20 urine histo >   Antimicrobials:  5/17 zosyn x1 , 5/20 >  5/17 vanc x1, 5/20 >  5/19 Ivermectin > 5/20  Interim history/subjective:  As above  Objective   Blood pressure 107/65, pulse 61, temperature 99.2 F  (37.3 C), temperature source Axillary, resp. rate (!) 24, SpO2 100 %.    Vent Mode: PRVC FiO2 (%):  [90 %-100 %] 100 % Set Rate:  [22 bmp-24 bmp] 24 bmp Vt Set:  [500 mL] 500 mL PEEP:  [15 cmH20-16 cmH20] 15 cmH20 Plateau Pressure:  [32 cmH20] 32 cmH20  No intake or output data in the 24 hours ending 12/25/2019 1817 There were no vitals filed for this visit.  Examination:  General:  In bed on vent HENT: NCAT ETT in place PULM: Crackles bases B, vent supported breathing CV: RRR, no mgr GI: BS+, soft, nontender MSK: normal bulk and tone Neuro: sedated on vent  Labs from [facility] reviewed, see COVID and viral antibody work up above, troponin elevated, lipase and amylase elevated, AST/ALT slightly elevated, Cr elevated, procalcitonin low, LDH, Ferritin both elevated  Resolved Hospital Problem list     Assessment & Plan:  ARDS> uncertain etiology, ddx includes another viral pneumonia, less likely acute inflammatory pneumonitis or undifferentiated ARDS > admit to ICU > bronchoscopy with BAL > send for cell count with diff, resp viral panel, PJP, cytology, aspergillus Ag, bacterial, fungal, AFB culture, SARS CoV2 PCR, serum b-d-glucan > ARDS ventilator settings > VAP prevention > repeat ABG, may need prone positioning > broad spectrum antibiotics > hold steroids as uncertain etiology of illness  History of COVID exposure> positive IgG antibody for SARS CoV2 with negative pcr and antigen for the same would strongly suggest that the patient has  been exposed to [place] several weeks ago but doesn't have active disease.  That said it is reasonable to perform a bronchoscopy to assess for COVID by sending off a [facility] PCR from the BAL > negative pressure room while awaiting bronchoscopy result > BAL SARS COV 2  AKI > repeat BMET > Monitor BMET and UOP > Replace electrolytes as needed  Need for sedation for mechanical ventilation RASS goal -2 PAD protocol> fentanyl, propfol  infusion  Elevated Lipase: discussed with radiology, no evidence of pancreatitis on admission CT angiogram Repeat lipase Consider repeat CT abdomen if lipase rising or condition worsens Continue tube feeding for now  Best practice:  Diet: start Pain/Anxiety/Delirium protocol (if indicated): as above VAP protocol (if indicated): yes DVT prophylaxis: sub q hep GI prophylaxis: Pantoprazole for stress ulcer prophylaxis Glucose control: SSI Mobility: bed rest Code Status: full Family Communication: called his wife Thayer Headings, left message Disposition: remain in ICU  Labs   CBC: Recent Labs  Lab 12/15/19 1100 12/16/19 0421  WBC 6.3 7.6  NEUTROABS 5.0  --   HGB 14.3 14.3  HCT 41.7 41.1  MCV 89.3 87.6  PLT 173 161    Basic Metabolic Panel: Recent Labs  Lab 12/15/19 1100 12/16/19 0421  NA 140 145  K 3.7 4.3  CL 108 113*  CO2 21* 23  GLUCOSE 174* 186*  BUN 60* 47*  CREATININE 1.92* 1.33*  CALCIUM 7.6* 8.3*  MG  --  3.3*  PHOS  --  3.5   GFR: Estimated Creatinine Clearance: 56.3 mL/min (A) (by C-G formula based on SCr of 1.33 mg/dL (H)). Recent Labs  Lab 12/15/19 1100 12/15/19 1410 12/16/19 0421 11/28/2019 0445  PROCALCITON  --  0.24 0.12 0.26  WBC 6.3  --  7.6  --   LATICACIDVEN 2.0*  --   --   --     Liver Function Tests: Recent Labs  Lab 12/15/19 1100 12/16/19 0421  AST 66* 40  ALT 68* 61*  ALKPHOS 42 46  BILITOT 1.2 1.1  PROT 7.1 6.9  ALBUMIN 2.9* 2.7*   Recent Labs  Lab 12/15/19 1821 12/16/19 0421  LIPASE 137* 315*  AMYLASE 142* 301*   No results for input(s): AMMONIA in the last 168 hours.  ABG    Component Value Date/Time   PHART 7.19 (LL) 12/07/2019 1459   PCO2ART 67 (HH) 12/06/2019 1459   PO2ART 162 (H) 12/09/2019 1459   HCO3 25.6 12/14/2019 1459   ACIDBASEDEF 4.0 (H) 12/26/2019 1459   O2SAT 99.1 12/23/2019 1459     Coagulation Profile: Recent Labs  Lab 12/15/19 1821  INR 1.3*    Cardiac Enzymes: No results for input(s):  CKTOTAL, CKMB, CKMBINDEX, TROPONINI in the last 168 hours.  HbA1C: Hgb A1c MFr Bld  Date/Time Value Ref Range Status  04/04/2018 09:00 AM 5.7 (H) 4.8 - 5.6 % Final    Comment:             Prediabetes: 5.7 - 6.4          Diabetes: >6.4          Glycemic control for adults with diabetes: <7.0     CBG: Recent Labs  Lab 12/15/19 1735  GLUCAP 209*    Review of Systems:   Cannot obtain due to intubation  Past Medical History  He,  has no past medical history on file.   Surgical History    Past Surgical History:  Procedure Laterality Date  . FINGER SURGERY  2005  .  [place]     Social History   reports that he has quit smoking. He has never used smokeless tobacco. He reports current alcohol use. He reports that he does not use drugs.   Family History   His family history includes Cancer in his father; Healthy in his brother and sister; Heart disease in his mother; Hypertension in his father.   Allergies No Known Allergies   Home Medications  Prior to Admission medications   Medication Sig Start Date End Date Taking? Authorizing Provider  amLODipine (NORVASC) 10 MG tablet Take 1 tablet (10 mg total) by mouth daily. 11/13/19  Yes Chrismon, Vickki Muff, PA  aspirin 81 MG tablet Take 81 mg by mouth daily.    Yes [provider]  fish oil-omega-3 fatty acids 1000 MG capsule Take 1,000 mg by mouth daily.    Yes [provider]  pravastatin (PRAVACHOL) 40 MG tablet TAKE 1 TABLET BY MOUTH EVERYDAY AT BEDTIME Patient taking differently: Take 40 mg by mouth at bedtime.  01/15/19  Yes Chrismon, Vickki Muff, PA  quinapril (ACCUPRIL) 20 MG tablet Take 1 tablet (20 mg total) by mouth daily. 11/13/19  Yes Chrismon, Vickki Muff, PA     Critical care time: 45 minutes     Roselie Awkward, MD [facility] Pager: [phone] Cell: [phone] If no response, call [phone]

## 2019-12-17 NOTE — Procedures (Signed)
Bronchoscopy  Indication: ARDS, ?COVID  Consent: Signed in chart, obtained verbally from wife over phone  Anesthesia: fentanyl and propofol already in place  Procedure - Timeout performed - Bronchoscope advanced through ETT - Airways examined down to subsegmental level - Following airway examination, BAL performed in LLL with return of clear fluid  Findings - Mild bronchial pitting - ETT in good position - No significant secretions  Specimen(s): BAL LLL  Complications: None immediate

## 2019-12-17 NOTE — Progress Notes (Signed)
Assisted Dr. Katrinka Blazing at bedside. No complications were noted.

## 2019-12-17 NOTE — Progress Notes (Signed)
Spoke with physician about patients immune response integrity.  Patient COVID markers for infection are approx 30 days plus.  His IgG test is positive but this indicates prior infection and adaptation.  Patient ordered off airborne precautions.

## 2019-12-17 NOTE — Procedures (Signed)
Endotracheal Intubation: Patient required placement of an artificial airway secondary to Respiratory Failure  Consent: Patient gave verbal consent prior to intubation and this was discussed with family  Hand washing performed prior to starting the procedure.   Medications administered for sedation prior to procedure:  Etomidate 73m,  Rocuronium 40 mg IV, Fentanyl 100 mcg IV.    A time out procedure was called and correct patient, name, & ID confirmed. Needed supplies and equipment were assembled and checked to include ETT, 10 ml syringe, Glidescope, Mac and Miller blades, suction, oxygen and bag mask valve, end tidal CO2 monitor.   Patient was positioned to align the mouth and pharynx to facilitate visualization of the glottis.   Heart rate, SpO2 and blood pressure was continuously monitored during the procedure. Pre-oxygenation was conducted prior to intubation and endotracheal tube was placed through the vocal cords into the trachea.     The artificial airway was placed under direct visualization via glidescope route using a 8.0 ETT on the first attempt.  ETT was secured at 23 cm mark.  Placement was confirmed by auscuitation of lungs with good breath sounds bilaterally and no stomach sounds.  Condensation was noted on endotracheal tube.   Pulse ox 98%.  CO2 detector in place with appropriate color change.   Complications: None .    Chest radiograph ordered and pending.   Comments: OGT placed via glidescope.    FOttie Glazier M.D.  [facility] [place]

## 2019-12-17 NOTE — Progress Notes (Signed)
Changed tidal volume to 370 (6cc) per order and increased RR to 30 to maintain a previous minute ventilation around 11. ABG to follow.

## 2019-12-17 NOTE — Progress Notes (Signed)
Patient began his morning with the BiPAP@100 % FiO2.  He was having difficulty breathing d/t hypoxemia and he felt claustrophobic. Placed patient on HHFNC @ 100% FiO2 on 50L.  Patient continued to mouth breath regardless of teaching.  Patient HR became tachycardic in the low 100's and he became irritated and tried to get uop and leave the bed.  His saturations desaturated into the low 70's.  Physician came to bedside and explained procedure of mechanical ventilation therapy to patient. Called patients family and explained course of care.  Obtained consent from patients wife after intubation to place CVC in Rt IJ for prolonged medical tx.  Sarah RN and Dr. Karna Christmas witnessed the confirmation via patients wife. Patient became extremely tachycardic and HTN after intubation on fentanyl and precedex.  Delivered 2.5mg  Lopressor to reduce HR.  Stopped precedex and Started propofol @ . and escalated to 0.66mcg.  Delivered 4mg  versed and PRN Rocuronium 50mg  IV push d/t patient inability to breath with the vent. Inserted foley cathetar and removed 1400cc urine yellow and clear.  Patient was transferred to [facility] for further tx. Will notify family of transfer.

## 2019-12-17 NOTE — Progress Notes (Signed)
CRITICAL CARE PROGRESS NOTE    Name: Bradley Chambers MRN: [id number] DOB: 18-Feb-1956     LOS: 2   SUBJECTIVE FINDINGS & SIGNIFICANT EVENTS   Patient description:  This is a 64 yo M with hx of lifelong smoking, reports feeling dyspneic with intermittent flu like symptoms "on and off", came in to ED with febrile illness found to be severely hypoxemic requiring BIPAP 100% to achieve spO2 89%.  He has normal BNP,,procal and wbc count, with elevation of ddimer and feritin which all points to COVID 19.  His COVID swab and rapid PCR were negative but overall ED doctor and myself still think high prob of COVID.  He has not been vaccinated in past and we have SARC-coV antibodies in process.   Lines / Drains: PIVx2  Cultures / Sepsis markers: Blood cultures COVID igG+   Antibiotics: Ivermectin  S/p 1x vanco and zosyn  5/18- admitted to MICU on BIPAP 100% 5/19- remains ventilator free on BIPAP, clinically slightly more comfortable but still with 100% fiO2 BIPAP.   5/20- patient clinically more labored breathing today, we discussed mechanical ventilation today, patient stated he does not feel that he can continue on BIPAP/high flow and agreed to intubation.  Spoke to wife Thayer Headings and met with daughter in law in person who is employed here.    PAST MEDICAL HISTORY   History reviewed. No pertinent past medical history.   SURGICAL HISTORY   Past Surgical History:  Procedure Laterality Date   FINGER SURGERY  2005   KNEE SURGERY  1990     FAMILY HISTORY   Family History  Problem Relation Age of Onset   Heart disease Mother    Healthy Sister    Healthy Brother    Cancer Father    Hypertension Father      SOCIAL HISTORY   Social History   Tobacco Use   Smoking status: Former Smoker   Smokeless  tobacco: Never Used   Tobacco comment: QUIT IN 1994  Substance Use Topics   Alcohol use: Yes    Alcohol/week: 0.0 standard drinks    Comment: OCCASIONALLY   Drug use: No     MEDICATIONS   Current Medication:  Current Facility-Administered Medications:    ascorbic acid injection 250 mg, 250 mg, Intravenous, Daily, Lanney Gins, Kevonta Phariss, MD, 250 mg at 12/18/2019 0106   chlorhexidine (PERIDEX) 0.12 % solution 15 mL, 15 mL, Mouth Rinse, BID, Orton Capell, MD, 15 mL at 12/04/2019 1106   Chlorhexidine Gluconate Cloth 2 % PADS 6 each, 6 each, Topical, Daily, Ottie Glazier, MD, 6 each at 12/16/19 2007   dexamethasone (DECADRON) injection 6 mg, 6 mg, Intravenous, Q24H, Darel Hong D, NP, 6 mg at 12/26/2019 1107   dexmedetomidine (PRECEDEX) 400 MCG/100ML (4 mcg/mL) infusion, 0.4-1.2 mcg/kg/hr, Intravenous, Titrated, Darilyn Storbeck, MD, Last Rate: 8.49 mL/hr at 12/21/2019 1300, 0.4 mcg/kg/hr at 12/09/2019 1300   docusate sodium (COLACE) capsule 100 mg, 100 mg, Oral, BID PRN, Ottie Glazier, MD   enoxaparin (LOVENOX) injection 40 mg, 40 mg, Subcutaneous, Q24H, Miku Udall, MD, 40 mg at 12/16/19 2148   famotidine (PEPCID) tablet 20 mg, 20 mg, Oral, BID, Dallie Piles, RPH, 20 mg at 12/16/2019 1106   fentaNYL 2550mg in NS 2534m(1013mml) infusion-PREMIX, 0-400 mcg/hr, Intravenous, Continuous, Daysi Boggan, MD, Last Rate: 10 mL/hr at 12/03/2019 1224, 100 mcg/hr at 12/07/2019 1224   furosemide (LASIX) injection 40 mg, 40 mg, Intravenous, Daily, AleLanney Ginsuad, MD, 40 mg at 12/09/2019 1106   furosemide (  LASIX) injection 40 mg, 40 mg, Intravenous, Once, Allessandra Bernardi, MD   insulin aspart (novoLOG) injection 0-9 Units, 0-9 Units, Subcutaneous, Q6H, Dallie Piles, RPH   ivermectin (STROMECTOL) tablet 19,500 mcg, 200 mcg/kg (Order-Specific), Oral, Daily, Ottie Glazier, MD, 19,500 mcg at 12/16/19 2003   MEDLINE mouth rinse, 15 mL, Mouth Rinse, q12n4p, Doyl Bitting, MD, 15 mL at  12/07/2019 1117   metoprolol tartrate (LOPRESSOR) injection 2.5 mg, 2.5 mg, Intravenous, Once, Tyrie Porzio, MD   polyethylene glycol (MIRALAX / GLYCOLAX) packet 17 g, 17 g, Oral, Daily PRN, Ottie Glazier, MD   traZODone (DESYREL) tablet 50 mg, 50 mg, Oral, QHS PRN, Bradly Bienenstock, NP    ALLERGIES   Patient has no known allergies.    REVIEW OF SYSTEMS   10 point ROS done and is neg except as per HPI  PHYSICAL EXAMINATION   Vital Signs: Temp:  [97.1 F (36.2 C)-98.7 F (37.1 C)] 97.1 F (36.2 C) (05/20 0800) Pulse Rate:  [57-118] 118 (05/20 1225) Resp:  [20-32] 22 (05/20 1225) BP: (104-186)/(52-130) 186/94 (05/20 1225) SpO2:  [80 %-94 %] 94 % (05/20 1225) FiO2 (%):  [60 %-100 %] 100 % (05/20 1215) Weight:  [84.9 kg] 84.9 kg (05/20 0108)  GENERAL Sedated on MV , age appropriate obese male HEAD: Normocephalic, atraumatic.  EYES: Pupils equal, round, reactive to light.  No scleral icterus.  MOUTH: Moist mucosal membrane. NECK: Supple. No thyromegaly. No nodules. No JVD.  PULMONARY: rhochi bilaterally CARDIOVASCULAR: S1 and S2. Regular rate and rhythm. No murmurs, rubs, or gallops.  GASTROINTESTINAL: Soft, nontender, non-distended. No masses. Positive bowel sounds. No hepatosplenomegaly.  MUSCULOSKELETAL: No swelling, clubbing, or edema.  NEUROLOGIC: Mild distress due to acute illness SKIN:intact,warm,dry   PERTINENT DATA     Infusions:  dexmedetomidine (PRECEDEX) IV infusion 0.4 mcg/kg/hr (12/13/2019 1300)   fentaNYL infusion INTRAVENOUS 100 mcg/hr (12/01/2019 1224)   Scheduled Medications:  ascorbic acid  250 mg Intravenous Daily   chlorhexidine  15 mL Mouth Rinse BID   Chlorhexidine Gluconate Cloth  6 each Topical Daily   dexamethasone (DECADRON) injection  6 mg Intravenous Q24H   enoxaparin (LOVENOX) injection  40 mg Subcutaneous Q24H   famotidine  20 mg Oral BID   furosemide  40 mg Intravenous Daily   furosemide  40 mg Intravenous Once    insulin aspart  0-9 Units Subcutaneous Q6H   ivermectin  200 mcg/kg (Order-Specific) Oral Daily   mouth rinse  15 mL Mouth Rinse q12n4p   metoprolol tartrate  2.5 mg Intravenous Once   PRN Medications:  Hemodynamic parameters:   Intake/Output: 05/19 0701 - 05/20 0700 In: 150 [IV Piggyback:150] Out: 1900 [Urine:1900]  Ventilator  Settings: Vent Mode: PRVC FiO2 (%):  [60 %-100 %] 100 % Set Rate:  [22 bmp] 22 bmp Vt Set:  [500 mL] 500 mL PEEP:  [16 cmH20] 16 cmH20    LAB RESULTS:  Basic Metabolic Panel: Recent Labs  Lab 12/15/19 1100 12/16/19 0421  NA 140 145  K 3.7 4.3  CL 108 113*  CO2 21* 23  GLUCOSE 174* 186*  BUN 60* 47*  CREATININE 1.92* 1.33*  CALCIUM 7.6* 8.3*  MG  --  3.3*  PHOS  --  3.5   Liver Function Tests: Recent Labs  Lab 12/15/19 1100 12/16/19 0421  AST 66* 40  ALT 68* 61*  ALKPHOS 42 46  BILITOT 1.2 1.1  PROT 7.1 6.9  ALBUMIN 2.9* 2.7*   Recent Labs  Lab 12/15/19 1821 12/16/19 0421  LIPASE 137* 315*  AMYLASE 142* 301*   No results for input(s): AMMONIA in the last 168 hours. CBC: Recent Labs  Lab 12/15/19 1100 12/16/19 0421  WBC 6.3 7.6  NEUTROABS 5.0  --   HGB 14.3 14.3  HCT 41.7 41.1  MCV 89.3 87.6  PLT 173 151   Cardiac Enzymes: No results for input(s): CKTOTAL, CKMB, CKMBINDEX, TROPONINI in the last 168 hours. BNP: Invalid input(s): POCBNP CBG: Recent Labs  Lab 12/15/19 1735  GLUCAP 209*       IMAGING RESULTS:  Imaging: DG Chest Port 1 View  Result Date: 12/16/2019 CLINICAL DATA:  COVID positive EXAM: PORTABLE CHEST 1 VIEW COMPARISON:  Yesterday FINDINGS: Patchy bilateral pulmonary infiltrate that is unchanged. No pleural effusion or pneumothorax. Cardiomegaly that is chronic. No pneumothorax. IMPRESSION: Stable multifocal pneumonia. Electronically Signed   By: Monte Fantasia M.D.   On: 12/16/2019 05:52   ECHOCARDIOGRAM COMPLETE  Result Date: 12/16/2019    ECHOCARDIOGRAM REPORT   Patient Name:   DIAGO HAIK Date of Exam: 12/16/2019 Medical Rec #:  [id number]     Height:       65.0 in Accession #:    [id number]    Weight:       205.2 lb Date of Birth:  1956/03/29    BSA:          2.000 m Patient Age:    87 years      BP:           111/71 mmHg Patient Gender: M             HR:           61 bpm. Exam Location:  [facility] Procedure: 2D Echo, Color Doppler and Cardiac Doppler Indications:     I50.31 CHF-Acute Diastolic  History:         Patient has no prior history of Echocardiogram examinations. Pt                  tested positive for COVID-19 on 12/15/2019.  Sonographer:     Charmayne Sheer RDCS (AE) Referring Phys:  [id number] Ottie Glazier Diagnosing Phys: Kate Sable MD  Sonographer Comments: Suboptimal parasternal window. Image acquisition challenging due to patient body habitus. IMPRESSIONS  1. Left ventricular ejection fraction, by estimation, is 55 to 60%. The left ventricle has normal function. The left ventricle has no regional wall motion abnormalities. Left ventricular diastolic parameters were normal.  2. Right ventricular systolic function is normal. The right ventricular size is mildly enlarged. There is moderately elevated pulmonary artery systolic pressure.  3. Left atrial size was mildly dilated.  4. The mitral valve is normal in structure. No evidence of mitral valve regurgitation.  5. The aortic valve is grossly normal. Aortic valve regurgitation is not visualized.  6. The inferior vena cava is normal in size with greater than 50% respiratory variability, suggesting right atrial pressure of 3 mmHg. FINDINGS  Left Ventricle: Left ventricular ejection fraction, by estimation, is 55 to 60%. The left ventricle has normal function. The left ventricle has no regional wall motion abnormalities. The left ventricular internal cavity size was normal in size. There is  no left ventricular hypertrophy. Left ventricular diastolic parameters were normal. Right Ventricle: The right ventricular size is mildly enlarged.  No increase in right ventricular wall thickness. Right ventricular systolic function is normal. There is moderately elevated pulmonary artery systolic pressure. The tricuspid regurgitant velocity is 3.27 m/s, and with an assumed right atrial  pressure of 3 mmHg, the estimated right ventricular systolic pressure is 09.7 mmHg. Left Atrium: Left atrial size was mildly dilated. Right Atrium: Right atrial size was normal in size. Pericardium: There is no evidence of pericardial effusion. Mitral Valve: The mitral valve is normal in structure. No evidence of mitral valve regurgitation. MV peak gradient, 2.7 mmHg. The mean mitral valve gradient is 1.0 mmHg. Tricuspid Valve: The tricuspid valve is grossly normal. Tricuspid valve regurgitation is not demonstrated. Aortic Valve: The aortic valve is grossly normal. Aortic valve regurgitation is not visualized. Aortic valve mean gradient measures 4.0 mmHg. Aortic valve peak gradient measures 8.5 mmHg. Aortic valve area, by VTI measures 2.50 cm. Pulmonic Valve: The pulmonic valve was not well visualized. Pulmonic valve regurgitation is not visualized. Aorta: The aortic root is normal in size and structure. Venous: The inferior vena cava is normal in size with greater than 50% respiratory variability, suggesting right atrial pressure of 3 mmHg. IAS/Shunts: No atrial level shunt detected by color flow Doppler.  LEFT VENTRICLE PLAX 2D LVIDd:         4.32 cm  Diastology LVIDs:         2.86 cm  LV e' lateral:   6.96 cm/s LV PW:         1.14 cm  LV E/e' lateral: 10.0 LV IVS:        0.82 cm  LV e' medial:    6.74 cm/s LVOT diam:     2.10 cm  LV E/e' medial:  10.4 LV SV:         66 LV SV Index:   33 LVOT Area:     3.46 cm  RIGHT VENTRICLE RV Basal diam:  4.73 cm LEFT ATRIUM             Index       RIGHT ATRIUM           Index LA diam:        3.00 cm 1.50 cm/m  RA Area:     21.80 cm LA Vol (A2C):   46.4 ml 23.20 ml/m RA Volume:   75.90 ml  37.95 ml/m LA Vol (A4C):   34.1 ml 17.05  ml/m LA Biplane Vol: 41.8 ml 20.90 ml/m  AORTIC VALVE                   PULMONIC VALVE AV Area (Vmax):    2.35 cm    PV Vmax:       1.13 m/s AV Area (Vmean):   2.36 cm    PV Vmean:      62.800 cm/s AV Area (VTI):     2.50 cm    PV VTI:        0.163 m AV Vmax:           146.00 cm/s PV Peak grad:  5.1 mmHg AV Vmean:          92.000 cm/s PV Mean grad:  2.0 mmHg AV VTI:            0.265 m AV Peak Grad:      8.5 mmHg AV Mean Grad:      4.0 mmHg LVOT Vmax:         99.20 cm/s LVOT Vmean:        62.700 cm/s LVOT VTI:          0.191 m LVOT/AV VTI ratio: 0.72  AORTA Ao Root diam: 3.20 cm MITRAL VALVE  TRICUSPID VALVE MV Area (PHT): 3.20 cm    TR Peak grad:   42.8 mmHg MV Peak grad:  2.7 mmHg    TR Vmax:        327.00 cm/s MV Mean grad:  1.0 mmHg MV Vmax:       0.82 m/s    SHUNTS MV Vmean:      47.2 cm/s   Systemic VTI:  0.19 m MV Decel Time: 237 msec    Systemic Diam: 2.10 cm MV E velocity: 69.80 cm/s MV A velocity: 72.80 cm/s MV E/A ratio:  0.96 Kate Sable MD Electronically signed by Kate Sable MD Signature Date/Time: 12/16/2019/12:30:40 PM    Final    _0 @ No results found.      ASSESSMENT AND PLAN    -Multidisciplinary rounds held today    Acute Hypoxic Respiratory Failure -patient on PRVC - ventilator management - currently on ARDS high PEEP ladder to maintain normoxia -patient was intubated-12/14/2019 -CVP trend -SARS-coV antibody testing + in patient who is unvaccinated indicative of COVID19 -as etiology of current hypoxemic respiratory failure. -patient did not receive any therapy for COVID while sick over past 1 month at home  - blood cultures in process-2D no growth -legionella and strep pmeunomia urine antigen ordered -lipase/amylase to R/O pancreatitis associated ARDS-mild elevation -continue Bronchodilator Therapy -TTE done today 12/16/19 to rule out CHF -systolic function is within reference range, possible pulmonary HTN may consider RHC on outpatient,   note BNP is 87   Acute Renal Failure stage 2-most likely due to ATN -follow chem 7 -follow UO -continue Foley Catheter-assess need daily -d/c nephrotoxic meds   Mild Protein calorie malnutrition  - nutritional RD dietary consultation   - OG tube + nourishement at initial 10/h with escalation to goal  ID -continue IV abx as prescibed -follow up cultures   GI/Nutrition GI PROPHYLAXIS as indicated DIET-->TF's as tolerated Constipation protocol as indicated    ENDO - ICU hypoglycemic\Hyperglycemia protocol -check FSBS per protocol   ELECTROLYTES -follow labs as needed -replace as needed -pharmacy consultation   DVT/GI PRX ordered -SCDs  TRANSFUSIONS AS NEEDED MONITOR FSBS ASSESS the need for LABS as needed   Critical care provider statement:    Critical care time (minutes):  33   Critical care time was exclusive of:  Separately billable procedures and treating other patients   Critical care was necessary to treat or prevent imminent or life-threatening deterioration of the following conditions:  acute hypoxemic respiratory failure, acute kidney injury , multipple comorbid coditions   Critical care was time spent personally by me on the following activities:  Development of treatment plan with patient or surrogate, discussions with consultants, evaluation of patient's response to treatment, examination of patient, obtaining history from patient or surrogate, ordering and performing treatments and interventions, ordering and review of laboratory studies and re-evaluation of patient's condition.  I assumed direction of critical care for this patient from another provider in my specialty: no    This document was prepared using Dragon voice recognition software and may include unintentional dictation errors.    Ottie Glazier, M.D.  Division of [place]

## 2019-12-17 NOTE — Progress Notes (Addendum)
Pharmacy Antibiotic Note  Bradley Chambers is a 64 y.o. male transferred to [facility] from [facility] on 12/15/2019 with pneumonia.  Pharmacy has been consulted for vancomycin/zosyn dosing. SCr trended down 1.92>1.33 (last bmet yesterday). Patient did receive 1x dose of vancomycin 1g IV and Zosyn at [facility] on 5/18.  Plan: Zosyn 3.375g IV ( infusion) x1; then 3.375g IV q8h (4h infusion) Vancomycin 1750mg  IV x1; then Vancomycin 750 mg IV Q 12 hrs Monitor clinical progress, c/s, renal function F/u de-escalation plan/LOT, vancomycin levels as indicated  ADDENDUM - SCr up to 2.06 on recheck tonight. Will adjust vancomycin dose. Loading doses already given.  Plan: Continue Zosyn 3.375g IV q8h (4h infusion) Adjust vancomycin to 1250mg  IV q24h Monitor clinical progress, c/s, renal function F/u de-escalation plan/LOT, vancomycin levels as indicated       Temp (24hrs), Avg:98.6 F (37 C), Min:97.1 F (36.2 C), Max:99.5 F (37.5 C)  Recent Labs  Lab 12/15/19 1100 12/16/19 0421  WBC 6.3 7.6  CREATININE 1.92* 1.33*  LATICACIDVEN 2.0*  --     Estimated Creatinine Clearance: 56.3 mL/min (A) (by C-G formula based on SCr of 1.33 mg/dL (H)).    No Known Allergies  Antimicrobials this admission: 5/20 vancomycin >>  5/20 zosyn >>   Dose adjustments this admission:   Microbiology results:   6/20, PharmD, BCPS Please check AMION for all [facility] contact numbers Clinical Pharmacist 12/26/2019 6:25 PM

## 2019-12-18 ENCOUNTER — Inpatient Hospital Stay (HOSPITAL_COMMUNITY): Payer: 59

## 2019-12-18 LAB — CBC
HCT: 44.5 % (ref 39.0–52.0)
Hemoglobin: 13.9 g/dL (ref 13.0–17.0)
MCH: 30.7 pg (ref 26.0–34.0)
MCHC: 31.2 g/dL (ref 30.0–36.0)
MCV: 98.2 fL (ref 80.0–100.0)
Platelets: 146 10*3/uL — ABNORMAL LOW (ref 150–400)
RBC: 4.53 MIL/uL (ref 4.22–5.81)
RDW: 13.1 % (ref 11.5–15.5)
WBC: 13.8 10*3/uL — ABNORMAL HIGH (ref 4.0–10.5)
nRBC: 0.2 % (ref 0.0–0.2)

## 2019-12-18 LAB — NOVEL CORONAVIRUS, NAA (HOSP ORDER, SEND-OUT TO REF LAB; TAT 18-24 HRS): SARS-CoV-2, NAA: DETECTED — AB

## 2019-12-18 LAB — BASIC METABOLIC PANEL
Anion gap: 10 (ref 5–15)
BUN: 51 mg/dL — ABNORMAL HIGH (ref 8–23)
CO2: 25 mmol/L (ref 22–32)
Calcium: 7.3 mg/dL — ABNORMAL LOW (ref 8.9–10.3)
Chloride: 109 mmol/L (ref 98–111)
Creatinine, Ser: 1.89 mg/dL — ABNORMAL HIGH (ref 0.61–1.24)
GFR calc Af Amer: 43 mL/min — ABNORMAL LOW (ref >60)
GFR calc non Af Amer: 37 mL/min — ABNORMAL LOW (ref >60)
Glucose, Bld: 261 mg/dL — ABNORMAL HIGH (ref 70–99)
Potassium: 5.5 mmol/L — ABNORMAL HIGH (ref 3.5–5.1)
Sodium: 144 mmol/L (ref 135–145)

## 2019-12-18 LAB — POCT I-STAT 7, (LYTES, BLD GAS, ICA,H+H)
Acid-base deficit: 1 mmol/L (ref 0.0–2.0)
Bicarbonate: 28.8 mmol/L — ABNORMAL HIGH (ref 20.0–28.0)
Calcium, Ion: 1.15 mmol/L (ref 1.15–1.40)
HCT: 39 % (ref 39.0–52.0)
Hemoglobin: 13.3 g/dL (ref 13.0–17.0)
O2 Saturation: 86 %
Potassium: 4.4 mmol/L (ref 3.5–5.1)
Sodium: 147 mmol/L — ABNORMAL HIGH (ref 135–145)
TCO2: 31 mmol/L (ref 22–32)
pCO2 arterial: 72.7 mmHg (ref 32.0–48.0)
pH, Arterial: 7.205 — ABNORMAL LOW (ref 7.350–7.450)
pO2, Arterial: 65 mmHg — ABNORMAL LOW (ref 83.0–108.0)

## 2019-12-18 LAB — LEGIONELLA PNEUMOPHILA SEROGP 1 UR AG: L. pneumophila Serogp 1 Ur Ag: NEGATIVE

## 2019-12-18 LAB — GLUCOSE, CAPILLARY
Glucose-Capillary: 137 mg/dL — ABNORMAL HIGH (ref 70–99)
Glucose-Capillary: 140 mg/dL — ABNORMAL HIGH (ref 70–99)
Glucose-Capillary: 194 mg/dL — ABNORMAL HIGH (ref 70–99)
Glucose-Capillary: 220 mg/dL — ABNORMAL HIGH (ref 70–99)
Glucose-Capillary: 230 mg/dL — ABNORMAL HIGH (ref 70–99)
Glucose-Capillary: 236 mg/dL — ABNORMAL HIGH (ref 70–99)
Glucose-Capillary: 238 mg/dL — ABNORMAL HIGH (ref 70–99)

## 2019-12-18 LAB — HEMOGLOBIN A1C
Hgb A1c MFr Bld: 6.5 % — ABNORMAL HIGH (ref 4.8–5.6)
Mean Plasma Glucose: 139.85 mg/dL

## 2019-12-18 LAB — PHOSPHORUS: Phosphorus: 7 mg/dL — ABNORMAL HIGH (ref 2.5–4.6)

## 2019-12-18 LAB — LEGIONELLA PNEUMOPHILA TOTAL AB: Legionella Pneumo Total Ab: <0.91 OD ratio (ref 0.00–0.90)

## 2019-12-18 LAB — MAGNESIUM: Magnesium: 3.2 mg/dL — ABNORMAL HIGH (ref 1.7–2.4)

## 2019-12-18 MED ORDER — INSULIN DETEMIR 100 UNIT/ML ~~LOC~~ SOLN
20.0000 [IU] | Freq: Two times a day (BID) | SUBCUTANEOUS | Status: DC
  Administered 2019-12-18 – 2019-12-28 (×22): 20 [IU] via SUBCUTANEOUS
  Filled 2019-12-18 (×25): qty 0.2

## 2019-12-18 MED ORDER — INSULIN ASPART 100 UNIT/ML ~~LOC~~ SOLN
0.0000 [IU] | SUBCUTANEOUS | Status: DC
  Administered 2019-12-18: 3 [IU] via SUBCUTANEOUS
  Administered 2019-12-18: 4 [IU] via SUBCUTANEOUS
  Administered 2019-12-18 (×2): 7 [IU] via SUBCUTANEOUS
  Administered 2019-12-18: 3 [IU] via SUBCUTANEOUS
  Administered 2019-12-19: 1 [IU] via SUBCUTANEOUS
  Administered 2019-12-19 (×2): 4 [IU] via SUBCUTANEOUS
  Administered 2019-12-19: 7 [IU] via SUBCUTANEOUS
  Administered 2019-12-19: 11 [IU] via SUBCUTANEOUS
  Administered 2019-12-20: 7 [IU] via SUBCUTANEOUS
  Administered 2019-12-20: 4 [IU] via SUBCUTANEOUS
  Administered 2019-12-20: 3 [IU] via SUBCUTANEOUS
  Administered 2019-12-20: 7 [IU] via SUBCUTANEOUS
  Administered 2019-12-21: 4 [IU] via SUBCUTANEOUS
  Administered 2019-12-21: 3 [IU] via SUBCUTANEOUS
  Administered 2019-12-21 (×2): 4 [IU] via SUBCUTANEOUS
  Administered 2019-12-21: 3 [IU] via SUBCUTANEOUS
  Administered 2019-12-21 – 2019-12-22 (×3): 4 [IU] via SUBCUTANEOUS
  Administered 2019-12-22: 3 [IU] via SUBCUTANEOUS
  Administered 2019-12-22 (×2): 4 [IU] via SUBCUTANEOUS
  Administered 2019-12-23: 7 [IU] via SUBCUTANEOUS
  Administered 2019-12-23: 4 [IU] via SUBCUTANEOUS
  Administered 2019-12-23: 3 [IU] via SUBCUTANEOUS
  Administered 2019-12-23: 4 [IU] via SUBCUTANEOUS
  Administered 2019-12-23: 3 [IU] via SUBCUTANEOUS
  Administered 2019-12-23 – 2019-12-24 (×4): 7 [IU] via SUBCUTANEOUS
  Administered 2019-12-24 (×2): 4 [IU] via SUBCUTANEOUS
  Administered 2019-12-24 – 2019-12-25 (×3): 7 [IU] via SUBCUTANEOUS
  Administered 2019-12-25: 11 [IU] via SUBCUTANEOUS
  Administered 2019-12-25: 7 [IU] via SUBCUTANEOUS
  Administered 2019-12-25 (×3): 4 [IU] via SUBCUTANEOUS
  Administered 2019-12-26: 7 [IU] via SUBCUTANEOUS
  Administered 2019-12-26 (×2): 4 [IU] via SUBCUTANEOUS
  Administered 2019-12-26 (×2): 7 [IU] via SUBCUTANEOUS
  Administered 2019-12-26: 4 [IU] via SUBCUTANEOUS
  Administered 2019-12-27 (×2): 7 [IU] via SUBCUTANEOUS
  Administered 2019-12-27 (×3): 4 [IU] via SUBCUTANEOUS
  Administered 2019-12-27: 7 [IU] via SUBCUTANEOUS
  Administered 2019-12-28 (×2): 4 [IU] via SUBCUTANEOUS
  Administered 2019-12-28: 3 [IU] via SUBCUTANEOUS
  Administered 2019-12-28 (×2): 4 [IU] via SUBCUTANEOUS
  Administered 2019-12-28: 3 [IU] via SUBCUTANEOUS
  Administered 2019-12-29: 4 [IU] via SUBCUTANEOUS
  Administered 2019-12-29: 3 [IU] via SUBCUTANEOUS
  Administered 2019-12-29: 4 [IU] via SUBCUTANEOUS
  Administered 2019-12-29: 3 [IU] via SUBCUTANEOUS
  Administered 2019-12-29: 4 [IU] via SUBCUTANEOUS
  Administered 2019-12-29: 7 [IU] via SUBCUTANEOUS
  Administered 2019-12-30: 4 [IU] via SUBCUTANEOUS
  Administered 2019-12-30: 3 [IU] via SUBCUTANEOUS
  Administered 2019-12-30 (×4): 4 [IU] via SUBCUTANEOUS
  Administered 2019-12-31 (×2): 3 [IU] via SUBCUTANEOUS
  Administered 2019-12-31 (×2): 4 [IU] via SUBCUTANEOUS
  Administered 2020-01-01 – 2020-01-02 (×5): 3 [IU] via SUBCUTANEOUS

## 2019-12-18 MED ORDER — SODIUM ZIRCONIUM CYCLOSILICATE 10 G PO PACK
10.0000 g | PACK | Freq: Once | ORAL | Status: AC
  Administered 2019-12-18: 10 g
  Filled 2019-12-18: qty 1

## 2019-12-18 MED ORDER — FREE WATER
400.0000 mL | Status: DC
  Administered 2019-12-18 – 2019-12-27 (×55): 400 mL

## 2019-12-18 MED ORDER — HEPARIN SODIUM (PORCINE) 5000 UNIT/ML IJ SOLN
7500.0000 [IU] | Freq: Three times a day (TID) | INTRAMUSCULAR | Status: DC
  Administered 2019-12-18 – 2019-12-29 (×32): 7500 [IU] via SUBCUTANEOUS
  Filled 2019-12-18 (×32): qty 2

## 2019-12-18 MED ORDER — CHLORHEXIDINE GLUCONATE CLOTH 2 % EX PADS
6.0000 | MEDICATED_PAD | Freq: Every day | CUTANEOUS | Status: DC
  Administered 2019-12-18 – 2020-01-02 (×16): 6 via TOPICAL

## 2019-12-18 MED ORDER — SODIUM CHLORIDE 0.9 % IV SOLN
1.0000 g | INTRAVENOUS | Status: AC
  Administered 2019-12-18 – 2019-12-22 (×5): 1 g via INTRAVENOUS
  Filled 2019-12-18 (×5): qty 10

## 2019-12-18 MED ORDER — SODIUM CHLORIDE 0.9 % IV SOLN
500.0000 mg | INTRAVENOUS | Status: AC
  Administered 2019-12-18 – 2019-12-20 (×3): 500 mg via INTRAVENOUS
  Filled 2019-12-18 (×3): qty 500

## 2019-12-18 MED ORDER — MIDAZOLAM 50MG/50ML (1MG/ML) PREMIX INFUSION: 0.5000 mg/h | INTRAVENOUS | Status: DC

## 2019-12-18 MED ORDER — VITAL 1.5 CAL PO LIQD
1000.0000 mL | ORAL | Status: DC
  Administered 2019-12-18 – 2019-12-24 (×4): 1000 mL
  Filled 2019-12-18 (×9): qty 1000

## 2019-12-18 MED ORDER — TOCILIZUMAB 400 MG/20ML IV SOLN
8.0000 mg/kg | Freq: Once | INTRAVENOUS | Status: AC
  Administered 2019-12-18: 714 mg via INTRAVENOUS
  Filled 2019-12-18: qty 35.7

## 2019-12-18 NOTE — Progress Notes (Signed)
Pt proned without complications and ETT secured with cloth tape.

## 2019-12-18 NOTE — Progress Notes (Signed)
eLink Physician-Brief Progress Note Patient Name: Bradley Chambers DOB: 29-Nov-1955 MRN: [id number]   Date of Service  12/18/2019  HPI/Events of Note  Multiple issues: 1. K+ = 6.7 --> 6.0 --> 5.8 and 2. Patient has Foley catheter and no order for Foley catheter.   eICU Interventions  Will order: 1. Lokelma 10 gm per tube now. 2. Place Foley catheter.      Intervention Category Major Interventions: Electrolyte abnormality - evaluation and management  Sommer,Steven Eugene 12/18/2019, 1:14 AM

## 2019-12-18 NOTE — Progress Notes (Signed)
Initial Nutrition Assessment  DOCUMENTATION CODES:   Obesity unspecified  INTERVENTION:   Tube feeding: - Vital 1.5 @ 50 ml/hr (1200 ml/day) via OG tube - Pro-stat 30 ml BID - Free water per CCM, currently 400 ml q 4 hours  Tube feeding regimen and current free water provides 2000 kcal, 111 grams of protein, and 3317 ml of H2O.   Tube feeding regimen and current propofol provides 2108 total kcal (100% of needs).  NUTRITION DIAGNOSIS:   Increased nutrient needs related to acute illness as evidenced by estimated needs.  GOAL:   Patient will meet greater than or equal to 90% of their needs  MONITOR:   Vent status, Labs, Weight trends, TF tolerance, I & O's  REASON FOR ASSESSMENT:   Ventilator, Consult Enteral/tube feeding initiation and management  ASSESSMENT:   64 year old male who was admitted on 12/15/19 for severe acute respiratory failure with hypoxemia secondary to COVID-19. Pt required intubation on December 30, 2019. PMH of HTN, HLD.   RD consulted for tube feeding initiation and management. OG tube in place per RN. Per RN, pt tolerating current tube feeding well.  Per RN edema assessment, pt with mild pitting generalized edema and mild pitting edema to BLE.  Reviewed weight history in chart. Pt with a 10.5 kg overall weight loss since 11/13/19. This is an 11.8% weight loss in 1 month which is significant for timeframe.  Potassium and phosphorus elevated. RD will continue to monitor and adjust TF formula if labs do not improve.  Current TF: Vital High Protein @ 40 ml/hr, Pro-stat 30 ml BID, free water 400 ml q 4 hours  Patient is currently intubated on ventilator support MV: 10.2 L/min Temp (24hrs), Avg:99.4 F (37.4 C), Min:99.2 F (37.3 C), Max:99.5 F (37.5 C) BP (cuff): 129/59 MAP (cuff): 79  Drips: Propofol: 4.1 ml/hr (provides 108 kcal daily from lipid) Fentanyl: 20 ml/hr Levophed: 41.3 ml/hr  Medications reviewed and include: decadron, SSI q 4 hours,  Levemir 20 units BID, protonix, miralax, IV abx, remdesivir, actemra   Labs reviewed: potassium 5.5, BUN 51, creatinine 1.89, ionized calcium 1.07, phosphorus 7.0, magnesium 3.2 CBG's: 230-269 x 24 hours  UOP: 1150 ml x 24 hours I/O's: +1.3 L since admit  NUTRITION - FOCUSED PHYSICAL EXAM:  Deferred.  Diet Order:   Diet Order            Diet NPO time specified  Diet effective now              EDUCATION NEEDS:   No education needs have been identified at this time  Skin:  Skin Assessment: Reviewed RN Assessment  Last BM:  no documented BM  Height:   Ht Readings from Last 1 Encounters:  12/15/19 5\' 5"  (1.651 m)    Weight:   Wt Readings from Last 1 Encounters:  12/18/19 89.3 kg    Ideal Body Weight:  61.8 kg  BMI:  Body mass index is 32.76 kg/m.  Estimated Nutritional Needs:   Kcal:  1800-2200  Protein:  110-130 grams  Fluid:  >/= 2.0 L    12/20/19, MS, RD, LDN Inpatient Clinical Dietitian Pager: [phone] Weekend/After Hours: [phone]

## 2019-12-18 NOTE — Progress Notes (Addendum)
NAME:  Bradley Chambers, MRN:  [id number], DOB:  Nov 21, 1955, LOS: 1 ADMISSION DATE:  12/08/2019, CONSULTATION DATE:  5/20 REFERRING MD:  Lanney Gins, CHIEF COMPLAINT:  Dyspnea   Brief History   64 y/o male admitted for severe acute respiratory failure with hypoxemia on 5/18, required intubation on 5/20.  He was initially treated for COVID.    History of present illness   This is a 64 y/o male with hypertension, hyperlipidemia and obesity presented 5/17 to the [facility] ED complaining of dyspnea with flu like symptoms.    Past Medical History  Hypertension Hyperlipidemia Obesity  Significant Hospital Events   5/18 came to [facility] ED, admitted to ICU off/on BIPAP, treated with remdesivir, decadron, ivermectin 5/20 intubated for severe acute respiratory failure with hypoxemia  Consults:  PCCM  Procedures:  5/20 ETT >  5/20 R IJ CVL >   Significant Diagnostic Tests:  5/18 CT angiogram chest > bilateral upper lobe predominant ground glass opacification and some patchy lower lobe consolidation, reactive lymphadenopathy, no PE 5/19 TTE> LVEF 55-60%, LV normal size/function, RV systolic function normal, RV size enlarged, moderately elevated PA pressure, left atrium mildly dilated, no mitral regurgitation 5/20 bronchoscopy >>>  Micro Data:  5/19 SARS CoV2 Ag > negative 5/19 SARS CoV2 PCR > negative 5/19 SARS CoV2 Ab > negative 5/18 Blood > negative 5/18 blood > negative 5/18 CMV IgM > negative 5/18 EBV IgG > elevated 5/18 EBV IgM > negative 5/18 HIV > negative 5/20 BAL bacterial culture >  5/20 BAL fungal culture >  5/20 BAL aspergillus antigen >  5/20 BAL PJP >  5/20 BAL respiratory viral panel 5/20 BAL SARS CoV 2 >  5/20 Nasal SARS CoV 2 >  5/20 serum fungitel>   Antimicrobials:  5/17 zosyn x1 , 5/20  5/17 vanc x1, 5/20 > 5/19 Ivermectin > 5/20 Azithromycin 5/21 >> 5/23 Ceftriaxone 5/21>>5/25  Interim history/subjective:  No events, high A-a gradient.  Objective   Blood  pressure (!) 120/55, pulse (!) 56, temperature 99.2 F (37.3 C), temperature source Axillary, resp. rate (!) 31, weight 89.3 kg, SpO2 97 %.    Vent Mode: PRVC FiO2 (%):  [80 %-100 %] 80 % Set Rate:  [22 bmp-30 bmp] 30 bmp Vt Set:  [370 mL-500 mL] 370 mL PEEP:  [15 cmH20-16 cmH20] 15 cmH20 Plateau Pressure:  [23 cmH20-32 cmH20] 24 cmH20   Intake/Output Summary (Last 24 hours) at 12/18/2019 0735 Last data filed at 12/18/2019 0600 Gross per 24 hour  Intake 1988.37 ml  Output 1150 ml  Net 838.37 ml   Filed Weights   12/18/19 0600  Weight: 89.3 kg    Examination:  GEN: obese sedated man on vent HEENT: ETT in place, small clear secretions CV: RRR, ext warm PULM: Suprisingly clear, intermittent triggering on vent on SIMV GI: Soft, +BS EXT: Trace edmea NEURO: does lift eyebrows to voice and has moved all 4 ext with sedation wean PSYCH: RASS -4 SKIN: No rashes  Cr improved K a little high Sodium a little high  Resolved Hospital Problem list     Assessment & Plan:  Acute hypoxemic respiratory failure due to COVID ARDS - Actemra, remdesivir, steroids - Prone 16h/8h while P/F ratio < 150 - Minimize driving pressure to < 15cmH2O if tolerated by pH - Pct mildly elevated will add short course of ceftriaxone, azithromycin - Increase DVT ppx to 7500 units insulin q8h, watch D dimer as potential surrogate risk marker of VTE  AKI, hyperkalemia- will improved  with insulin and started some free water - Trend  Hyperglycemia- started levemir, continue SSI  Hx HTN- can add home meds PRN  Need for sedation for mechanical ventilation - Propofol, fentanyl, keep an eye on triglycerides - Can use versed if more sedation needed - May need paralytic depending on lung compliance  Best practice:  Diet: start  Pain/Anxiety/Delirium protocol (if indicated): as above VAP protocol (if indicated): yes DVT prophylaxis: sub q hep GI prophylaxis: PPI Glucose control: SSI Mobility: bed  rest Code Status: full Family Communication: called and updated wife Thayer Headings 5/21.  She consents to actemra after hearing risk/benefit Disposition: remain in ICU  The patient is critically ill with multiple organ systems failure and requires high complexity decision making for assessment and support, frequent evaluation and titration of therapies, application of advanced monitoring technologies and extensive interpretation of multiple databases. Critical Care Time devoted to patient care services described in this note independent of APP/resident time (if applicable)  is 45 minutes.   Erskine Emery MD [facility] 12/18/2019 3:06 PM Personal pager: [phone] If unanswered, please page CCM On-call: [phone]

## 2019-12-19 ENCOUNTER — Inpatient Hospital Stay (HOSPITAL_COMMUNITY): Payer: 59

## 2019-12-19 LAB — POCT I-STAT 7, (LYTES, BLD GAS, ICA,H+H)
Acid-Base Excess: 3 mmol/L — ABNORMAL HIGH (ref 0.0–2.0)
Bicarbonate: 32.8 mmol/L — ABNORMAL HIGH (ref 20.0–28.0)
Calcium, Ion: 1.15 mmol/L (ref 1.15–1.40)
HCT: 40 % (ref 39.0–52.0)
Hemoglobin: 13.6 g/dL (ref 13.0–17.0)
O2 Saturation: 100 %
Patient temperature: 96.9
Potassium: 5.1 mmol/L (ref 3.5–5.1)
Sodium: 144 mmol/L (ref 135–145)
TCO2: 35 mmol/L — ABNORMAL HIGH (ref 22–32)
pCO2 arterial: 73.7 mmHg (ref 32.0–48.0)
pH, Arterial: 7.251 — ABNORMAL LOW (ref 7.350–7.450)
pO2, Arterial: 227 mmHg — ABNORMAL HIGH (ref 83.0–108.0)

## 2019-12-19 LAB — CBC
HCT: 44.1 % (ref 39.0–52.0)
Hemoglobin: 13.5 g/dL (ref 13.0–17.0)
MCH: 30.6 pg (ref 26.0–34.0)
MCHC: 30.6 g/dL (ref 30.0–36.0)
MCV: 100 fL (ref 80.0–100.0)
Platelets: 131 10*3/uL — ABNORMAL LOW (ref 150–400)
RBC: 4.41 MIL/uL (ref 4.22–5.81)
RDW: 12.9 % (ref 11.5–15.5)
WBC: 8.9 10*3/uL (ref 4.0–10.5)
nRBC: 0 % (ref 0.0–0.2)

## 2019-12-19 LAB — BASIC METABOLIC PANEL
Anion gap: 8 (ref 5–15)
BUN: 32 mg/dL — ABNORMAL HIGH (ref 8–23)
CO2: 27 mmol/L (ref 22–32)
Calcium: 7.8 mg/dL — ABNORMAL LOW (ref 8.9–10.3)
Chloride: 110 mmol/L (ref 98–111)
Creatinine, Ser: 1.02 mg/dL (ref 0.61–1.24)
GFR calc Af Amer: >60 mL/min (ref >60)
GFR calc non Af Amer: >60 mL/min (ref >60)
Glucose, Bld: 218 mg/dL — ABNORMAL HIGH (ref 70–99)
Potassium: 6.3 mmol/L (ref 3.5–5.1)
Sodium: 145 mmol/L (ref 135–145)

## 2019-12-19 LAB — PHOSPHORUS: Phosphorus: 3.7 mg/dL (ref 2.5–4.6)

## 2019-12-19 LAB — BASIC METABOLIC PANEL WITH GFR
Anion gap: 7 (ref 5–15)
BUN: 28 mg/dL — ABNORMAL HIGH (ref 8–23)
CO2: 30 mmol/L (ref 22–32)
Calcium: 7.6 mg/dL — ABNORMAL LOW (ref 8.9–10.3)
Chloride: 108 mmol/L (ref 98–111)
Creatinine, Ser: 0.89 mg/dL (ref 0.61–1.24)
GFR calc Af Amer: >60 mL/min (ref >60)
GFR calc non Af Amer: >60 mL/min (ref >60)
Glucose, Bld: 276 mg/dL — ABNORMAL HIGH (ref 70–99)
Potassium: 5.1 mmol/L (ref 3.5–5.1)
Sodium: 145 mmol/L (ref 135–145)

## 2019-12-19 LAB — MAGNESIUM: Magnesium: 3.4 mg/dL — ABNORMAL HIGH (ref 1.7–2.4)

## 2019-12-19 LAB — CULTURE, RESPIRATORY W GRAM STAIN: Culture: NO GROWTH

## 2019-12-19 LAB — GLUCOSE, CAPILLARY
Glucose-Capillary: 182 mg/dL — ABNORMAL HIGH (ref 70–99)
Glucose-Capillary: 222 mg/dL — ABNORMAL HIGH (ref 70–99)
Glucose-Capillary: 255 mg/dL — ABNORMAL HIGH (ref 70–99)
Glucose-Capillary: 171 mg/dL — ABNORMAL HIGH (ref 70–99)
Glucose-Capillary: 121 mg/dL — ABNORMAL HIGH (ref 70–99)
Glucose-Capillary: 87 mg/dL (ref 70–99)

## 2019-12-19 LAB — ASPERGILLUS ANTIGEN, BAL/SERUM: Aspergillus Ag, BAL/Serum: 0.05 Index (ref 0.00–0.49)

## 2019-12-19 LAB — D-DIMER, QUANTITATIVE: D-Dimer, Quant: >20 ug/mL-FEU — ABNORMAL HIGH (ref 0.00–0.50)

## 2019-12-19 LAB — FUNGITELL, SERUM: Fungitell Result: <31 pg/mL (ref <80)

## 2019-12-19 MED ORDER — FUROSEMIDE 10 MG/ML IJ SOLN
40.0000 mg | Freq: Two times a day (BID) | INTRAMUSCULAR | Status: AC
  Administered 2019-12-19 (×2): 40 mg via INTRAVENOUS
  Filled 2019-12-19 (×2): qty 4

## 2019-12-19 MED ORDER — SODIUM ZIRCONIUM CYCLOSILICATE 10 G PO PACK
10.0000 g | PACK | Freq: Once | ORAL | Status: AC
  Administered 2019-12-19: 10 g
  Filled 2019-12-19: qty 1

## 2019-12-19 MED ORDER — FENTANYL BOLUS VIA INFUSION
50.0000 ug | INTRAVENOUS | Status: DC | PRN
  Administered 2019-12-19 – 2019-12-27 (×21): 50 ug via INTRAVENOUS
  Filled 2019-12-19: qty 50

## 2019-12-19 MED ORDER — VECURONIUM BOLUS VIA INFUSION
0.0800 mg/kg | Freq: Once | INTRAVENOUS | Status: AC
  Administered 2019-12-19: 7.3 mg via INTRAVENOUS
  Filled 2019-12-19: qty 8

## 2019-12-19 MED ORDER — FENTANYL 2500MCG IN NS 250ML (10MCG/ML) PREMIX INFUSION
50.0000 ug/h | INTRAVENOUS | Status: DC
  Administered 2019-12-19 – 2019-12-21 (×9): 300 ug/h via INTRAVENOUS
  Administered 2019-12-22 – 2019-12-23 (×4): 200 ug/h via INTRAVENOUS
  Administered 2019-12-24 – 2019-12-27 (×8): 250 ug/h via INTRAVENOUS
  Administered 2019-12-27: 225 ug/h via INTRAVENOUS
  Administered 2019-12-28 (×2): 300 ug/h via INTRAVENOUS
  Administered 2019-12-28: 275 ug/h via INTRAVENOUS
  Administered 2019-12-29: 300 ug/h via INTRAVENOUS
  Filled 2019-12-19 (×25): qty 250

## 2019-12-19 MED ORDER — NOREPINEPHRINE 16 MG/250ML-% IV SOLN
0.0000 ug/min | INTRAVENOUS | Status: DC
  Administered 2019-12-19: 7 ug/min via INTRAVENOUS
  Administered 2019-12-23: 6 ug/min via INTRAVENOUS
  Filled 2019-12-19 (×2): qty 250

## 2019-12-19 MED ORDER — VECURONIUM BROMIDE 10 MG IV SOLR
0.0000 ug/kg/min | INTRAVENOUS | Status: DC
  Administered 2019-12-19 – 2019-12-20 (×3): 1 ug/kg/min via INTRAVENOUS
  Filled 2019-12-19 (×4): qty 100

## 2019-12-19 MED ORDER — MIDAZOLAM BOLUS VIA INFUSION
1.0000 mg | INTRAVENOUS | Status: DC | PRN
  Administered 2019-12-19 (×3): 1 mg via INTRAVENOUS
  Administered 2019-12-21: 2 mg via INTRAVENOUS
  Administered 2019-12-21: 1 mg via INTRAVENOUS
  Administered 2019-12-21 (×2): 2 mg via INTRAVENOUS
  Administered 2019-12-21 – 2019-12-26 (×5): 1 mg via INTRAVENOUS
  Administered 2019-12-27 – 2020-01-01 (×6): 2 mg via INTRAVENOUS
  Administered 2020-01-02: 1 mg via INTRAVENOUS
  Filled 2019-12-19: qty 2

## 2019-12-19 MED ORDER — ARTIFICIAL TEARS OPHTHALMIC OINT
1.0000 "application " | TOPICAL_OINTMENT | Freq: Three times a day (TID) | OPHTHALMIC | Status: DC
  Administered 2019-12-19 – 2019-12-21 (×9): 1 via OPHTHALMIC
  Filled 2019-12-19: qty 3.5

## 2019-12-19 MED ORDER — MIDAZOLAM 50MG/50ML (1MG/ML) PREMIX INFUSION
2.0000 mg/h | INTRAVENOUS | Status: DC
  Administered 2019-12-19: 4 mg/h via INTRAVENOUS
  Administered 2019-12-19: 2 mg/h via INTRAVENOUS
  Administered 2019-12-19 – 2019-12-20 (×3): 4 mg/h via INTRAVENOUS
  Administered 2019-12-21 (×2): 9 mg/h via INTRAVENOUS
  Administered 2019-12-21: 7 mg/h via INTRAVENOUS
  Administered 2019-12-22: 8 mg/h via INTRAVENOUS
  Administered 2019-12-22: 6 mg/h via INTRAVENOUS
  Administered 2019-12-22: 5 mg/h via INTRAVENOUS
  Administered 2019-12-23: 3 mg/h via INTRAVENOUS
  Administered 2019-12-23: 5 mg/h via INTRAVENOUS
  Administered 2019-12-24: 4 mg/h via INTRAVENOUS
  Administered 2019-12-24: 3 mg/h via INTRAVENOUS
  Administered 2019-12-25 – 2019-12-26 (×3): 4 mg/h via INTRAVENOUS
  Administered 2019-12-27: 2 mg/h via INTRAVENOUS
  Administered 2019-12-27: 4 mg/h via INTRAVENOUS
  Administered 2019-12-28: 6 mg/h via INTRAVENOUS
  Administered 2019-12-28: 2 mg/h via INTRAVENOUS
  Administered 2019-12-29: 6 mg/h via INTRAVENOUS
  Administered 2019-12-29: 4.5 mg/h via INTRAVENOUS
  Administered 2019-12-29 – 2019-12-30 (×4): 6 mg/h via INTRAVENOUS
  Administered 2019-12-31: 5 mg/h via INTRAVENOUS
  Administered 2019-12-31 (×2): 8 mg/h via INTRAVENOUS
  Administered 2020-01-01: 10 mg/h via INTRAVENOUS
  Administered 2020-01-01: 8 mg/h via INTRAVENOUS
  Administered 2020-01-01 – 2020-01-02 (×4): 10 mg/h via INTRAVENOUS
  Filled 2019-12-19 (×39): qty 50

## 2019-12-19 MED ORDER — FENTANYL CITRATE (PF) 100 MCG/2ML IJ SOLN
50.0000 ug | Freq: Once | INTRAMUSCULAR | Status: AC
  Administered 2019-12-19: 50 ug via INTRAVENOUS

## 2019-12-19 MED ORDER — SODIUM CHLORIDE 0.9 % IV SOLN: INTRAVENOUS | Status: DC | PRN

## 2019-12-19 NOTE — Progress Notes (Signed)
eLink Physician-Brief Progress Note Patient Name: Bradley Chambers DOB: 1956/04/10 MRN: [id number]   Date of Service  12/19/2019  HPI/Events of Note  Hyperkalemia - K+ = 6.3. WRS not widened or T wave peaked on bedside monitor.   eICU Interventions  Will order: 1. Lokelma 10 gm per tube now. 2. Repeat BMP at 12 noon.      Intervention Category Major Interventions: Electrolyte abnormality - evaluation and management  Belynda Pagaduan Eugene 12/19/2019, 5:04 AM

## 2019-12-19 NOTE — Progress Notes (Signed)
Patient placed back in supine position by Rt x 1, NT x 1, and RN x 3 without any complications at5 0820.  ETT re-secured by a commercial tube holder on the left.

## 2019-12-19 NOTE — Progress Notes (Signed)
NAME:  Bradley Chambers, MRN:  [id number], DOB:  Jun 14, 1956, LOS: 2 ADMISSION DATE:  11/30/2019, CONSULTATION DATE:  5/20 REFERRING MD:  Lanney Gins, CHIEF COMPLAINT:  Dyspnea   Brief History   64 y/o male admitted for severe acute respiratory failure with hypoxemia on 5/18, required intubation on 5/20.  He was initially treated for COVID.    History of present illness   This is a 64 y/o male with hypertension, hyperlipidemia and obesity presented 5/17 to the [facility] ED complaining of dyspnea with flu like symptoms.    Past Medical History  Hypertension Hyperlipidemia Obesity  Significant Hospital Events   5/18 came to [facility] ED, admitted to ICU off/on BIPAP, treated with remdesivir, decadron, ivermectin 5/20 intubated for severe acute respiratory failure with hypoxemia  Consults:  PCCM  Procedures:  5/20 ETT >  5/20 R IJ CVL >   Significant Diagnostic Tests:  5/18 CT angiogram chest > bilateral upper lobe predominant ground glass opacification and some patchy lower lobe consolidation, reactive lymphadenopathy, no PE 5/19 TTE> LVEF 55-60%, LV normal size/function, RV systolic function normal, RV size enlarged, moderately elevated PA pressure, left atrium mildly dilated, no mitral regurgitation 5/20 bronchoscopy >>>  Micro Data:  5/19 SARS CoV2 Ag > negative 5/19 SARS CoV2 PCR > negative 5/19 SARS CoV2 Ab > negative 5/18 Blood > negative 5/18 blood > negative 5/18 CMV IgM > negative 5/18 EBV IgG > elevated 5/18 EBV IgM > negative 5/18 HIV > negative 5/20 BAL bacterial culture >  5/20 BAL fungal culture >  5/20 BAL aspergillus antigen > 0.05 5/20 BAL PJP >  5/20 BAL respiratory viral panel >> negative 5/20 BAL SARS CoV 2 >  5/20 Nasal SARS CoV 2 >  5/20 serum fungitel>   Antimicrobials:  5/17 zosyn x1 , 5/20  5/17 vanc x1, 5/20 > 5/19 Ivermectin > 5/20 Azithromycin 5/21 >> 5/23 Ceftriaxone 5/21>>5/25  Interim history/subjective:   Hyperkalemic a.m. 5/22, Lokelma  given I/O+ 2.7 L total Fentanyl 400, propofol 50, norepinephrine 9 SIMV 400x25, PS18, FiO2 1.00, PEEP 18  Objective   Blood pressure 115/62, pulse (!) 58, temperature 97.7 F (36.5 C), temperature source Oral, resp. rate (!) 25, weight 91 kg, SpO2 97 %.    Vent Mode: SIMV;PSV FiO2 (%):  [60 %-100 %] 100 % Set Rate:  [20 bmp-30 bmp] 25 bmp Vt Set:  [370 mL-400 mL] 400 mL PEEP:  [12 cmH20-18 cmH20] 18 cmH20 Pressure Support:  [8 cmH20] 8 cmH20 Plateau Pressure:  [22 cmH20-31 cmH20] 22 cmH20   Intake/Output Summary (Last 24 hours) at 12/19/2019 5621 Last data filed at 12/19/2019 3086 Gross per 24 hour  Intake 3954.45 ml  Output 2055 ml  Net 1899.45 ml   Filed Weights   12/18/19 0600 12/19/19 0339  Weight: 89.3 kg 91 kg    Examination:  GEN: Obese man, sedated, intubated, now in supine position HEENT: ET tube in place, no cuff leak CV: Regular, distant, tachycardic, no murmur PULM: Coarse bilateral breath sounds, distant, no wheeze on SIMV GI: Obese, nondistended, positive bowel sounds EXT: Extremity edema NEURO: Sedated, some grimace with stimulation, pain, does not wake to voice, does not follow commands PSYCH: RASS -4 SKIN: No rash    Resolved Hospital Problem list     Assessment & Plan:  Acute hypoxemic respiratory failure due to COVID ARDS Corticosteroids, plan 10 days Remdesivir, plan 5 days Tocilizumab given Low tidal volume ventilator strategy.  Plan to transition to [facility] from SIMV on 5/22 if  able to tolerate Plan to initiate continuous paralytics 5/22 Prone positioning to enhance gas exchange Volume removal, diuresis as blood pressure and renal function can tolerate, goal CVP 5-8 Azithromycin, ceftriaxone as ordered  Shock, multifactorial, likely contribution of sedating medications plus sepsis Wean norepinephrine as able  AKI, hyperkalemia-serum creatinine improved, remains hyperkalemic Lokelma given 5/22 Tight glucose control Follow urine output and  BMP Diuresis today 5/22  Hyperglycemia-  Sliding-scale insulin resistant scale Levemir 20 units twice daily  Hx HTN-  Hold home antihypertensives  Need for sedation for mechanical ventilation Currently fentanyl, propofol.  Consider transition to midazolam, following triglycerides Consider addition paralytic based on gas exchange as above   Best practice:  Diet: start TF Pain/Anxiety/Delirium protocol (if indicated): as above VAP protocol (if indicated): yes DVT prophylaxis: sub q hep GI prophylaxis: PPI Glucose control: SSI, levemir Mobility: bed rest Code Status: full Family Communication: called wife, no answer, left message 5/22 Disposition: remain in ICU  The patient is critically ill with multiple organ systems failure and requires high complexity decision making for assessment and support, frequent evaluation and titration of therapies, application of advanced monitoring technologies and extensive interpretation of multiple databases. Critical Care Time devoted to patient care services described in this note independent of APP/resident time (if applicable)  is 33 minutes.    Baltazar Apo, MD, PhD 12/19/2019, 6:57 AM  Pulmonary and Critical Care [phone] or if no answer [phone]

## 2019-12-19 NOTE — Progress Notes (Signed)
Critical ABG values given to Dr. Delton Coombes.  Vent changes made per his order.

## 2019-12-20 ENCOUNTER — Inpatient Hospital Stay (HOSPITAL_COMMUNITY): Payer: 59

## 2019-12-20 LAB — BASIC METABOLIC PANEL
Anion gap: 7 (ref 5–15)
BUN: 30 mg/dL — ABNORMAL HIGH (ref 8–23)
CO2: 32 mmol/L (ref 22–32)
Calcium: 7.8 mg/dL — ABNORMAL LOW (ref 8.9–10.3)
Chloride: 107 mmol/L (ref 98–111)
Creatinine, Ser: 0.79 mg/dL (ref 0.61–1.24)
GFR calc Af Amer: >60 mL/min (ref >60)
GFR calc non Af Amer: >60 mL/min (ref >60)
Glucose, Bld: 231 mg/dL — ABNORMAL HIGH (ref 70–99)
Potassium: 4.9 mmol/L (ref 3.5–5.1)
Sodium: 146 mmol/L — ABNORMAL HIGH (ref 135–145)

## 2019-12-20 LAB — CULTURE, BLOOD (ROUTINE X 2)
Culture: NO GROWTH
Culture: NO GROWTH
Culture: NO GROWTH
Culture: NO GROWTH
Special Requests: ADEQUATE
Special Requests: ADEQUATE
Special Requests: ADEQUATE
Special Requests: ADEQUATE

## 2019-12-20 LAB — CBC
HCT: 40.6 % (ref 39.0–52.0)
Hemoglobin: 12.7 g/dL — ABNORMAL LOW (ref 13.0–17.0)
MCH: 30 pg (ref 26.0–34.0)
MCHC: 31.3 g/dL (ref 30.0–36.0)
MCV: 95.8 fL (ref 80.0–100.0)
Platelets: 108 10*3/uL — ABNORMAL LOW (ref 150–400)
RBC: 4.24 MIL/uL (ref 4.22–5.81)
RDW: 12.8 % (ref 11.5–15.5)
WBC: 4.1 10*3/uL (ref 4.0–10.5)
nRBC: 0.5 % — ABNORMAL HIGH (ref 0.0–0.2)

## 2019-12-20 LAB — GLUCOSE, CAPILLARY
Glucose-Capillary: 178 mg/dL — ABNORMAL HIGH (ref 70–99)
Glucose-Capillary: 220 mg/dL — ABNORMAL HIGH (ref 70–99)
Glucose-Capillary: 221 mg/dL — ABNORMAL HIGH (ref 70–99)
Glucose-Capillary: 114 mg/dL — ABNORMAL HIGH (ref 70–99)
Glucose-Capillary: 118 mg/dL — ABNORMAL HIGH (ref 70–99)
Glucose-Capillary: 138 mg/dL — ABNORMAL HIGH (ref 70–99)

## 2019-12-20 LAB — D-DIMER, QUANTITATIVE: D-Dimer, Quant: 18.49 ug/mL-FEU — ABNORMAL HIGH (ref 0.00–0.50)

## 2019-12-20 LAB — MAGNESIUM: Magnesium: 2.4 mg/dL (ref 1.7–2.4)

## 2019-12-20 LAB — PHOSPHORUS: Phosphorus: 2.7 mg/dL (ref 2.5–4.6)

## 2019-12-20 MED ORDER — FUROSEMIDE 10 MG/ML IJ SOLN
40.0000 mg | Freq: Two times a day (BID) | INTRAMUSCULAR | Status: AC
  Administered 2019-12-20 (×2): 40 mg via INTRAVENOUS
  Filled 2019-12-20 (×2): qty 4

## 2019-12-20 NOTE — Progress Notes (Signed)
Daughter was updated on plan of care and patient status over the phone. All questions were answered at this time.

## 2019-12-20 NOTE — Progress Notes (Signed)
NAME:  Bradley Chambers, MRN:  [id number], DOB:  May 01, 1956, LOS: 3 ADMISSION DATE:  12/27/2019, CONSULTATION DATE:  5/20 REFERRING MD:  Lanney Gins, CHIEF COMPLAINT:  Dyspnea   Brief History   64 y/o male admitted for severe acute respiratory failure with hypoxemia on 5/18, required intubation on 5/20.  He was initially treated for COVID.    History of present illness   This is a 64 y/o male with hypertension, hyperlipidemia and obesity presented 5/17 to the [facility] ED complaining of dyspnea with flu like symptoms.    Past Medical History  Hypertension Hyperlipidemia Obesity  Significant Hospital Events   5/18 came to [facility] ED, admitted to ICU off/on BIPAP, treated with remdesivir, decadron, ivermectin 5/20 intubated for severe acute respiratory failure with hypoxemia  Consults:  PCCM  Procedures:  5/20 ETT >  5/20 R IJ CVL >   Significant Diagnostic Tests:  5/18 CT angiogram chest > bilateral upper lobe predominant ground glass opacification and some patchy lower lobe consolidation, reactive lymphadenopathy, no PE 5/19 TTE> LVEF 55-60%, LV normal size/function, RV systolic function normal, RV size enlarged, moderately elevated PA pressure, left atrium mildly dilated, no mitral regurgitation 5/20 bronchoscopy >>>  Micro Data:  5/19 SARS CoV2 Ag > negative 5/19 SARS CoV2 PCR > negative 5/19 SARS CoV2 Ab > negative 5/18 Blood > negative 5/18 blood > negative 5/18 CMV IgM > negative 5/18 EBV IgG > elevated 5/18 EBV IgM > negative 5/18 HIV > negative 5/20 BAL bacterial culture >  5/20 BAL fungal culture > negative 5/20 BAL aspergillus antigen > 0.05 5/20 BAL PJP >  5/20 BAL respiratory viral panel >> negative 5/20 BAL SARS CoV 2 >  5/20 Nasal SARS CoV 2 > positive 5/20 serum fungitel>   Antimicrobials:  5/17 zosyn x1 , 5/20  5/17 vanc x1, 5/20 > 5/19 Ivermectin > 5/20 Azithromycin 5/21 >> 5/23 Ceftriaxone 5/21>>5/25  Interim history/subjective:   FiO2 0.50, PEEP  12 I/O+ 2.7 L total Fentanyl 300, midazolam 4, vecuronium Norepinephrine weaned to off   Objective   Blood pressure (!) 103/58, pulse 62, temperature 98.9 F (37.2 C), temperature source Oral, resp. rate (!) 32, height _0  (1.651 m), weight 91.9 kg, SpO2 95 %.    Vent Mode: PRVC FiO2 (%):  [50 %-100 %] 50 % Set Rate:  [28 bmp-32 bmp] 32 bmp Vt Set:  [370 mL] 370 mL PEEP:  [14 cmH20-18 cmH20] 14 cmH20 Plateau Pressure:  [24 cmH20-27 cmH20] 26 cmH20   Intake/Output Summary (Last 24 hours) at 12/20/2019 7858 Last data filed at 12/20/2019 0700 Gross per 24 hour  Intake 4040.06 ml  Output 4200 ml  Net -159.94 ml   Filed Weights   12/18/19 0600 12/19/19 0339 12/20/19 0400  Weight: 89.3 kg 91 kg 91.9 kg    Examination:  GEN: Obese man, supine position, ventilated HEENT: ET tube in place, no cuff leak CV: Distant, regular, no murmur PULM: Bilateral inspiratory crackles, no wheezing GI: Obese, nondistended, positive bowel sounds EXT: No significant edema NEURO: sedated and paralyzed, does not respond to stimulation PSYCH: RASS -4 SKIN: No rash    Resolved Hospital Problem list     Assessment & Plan:  Acute hypoxemic respiratory failure due to COVID ARDS Corticosteroids, plan 10 days Remdesivir, plan 5 days Tocilizumab given Tolerate transition to PRVC, low tidal volume ventilation 6 cc/kg.  Plan to continue. Wean PEEP and FiO2. Goal, off paralytics today 5/23 Prone positioning to enhance gas exchange Volume removal as he  can tolerate, goal CVP 5-8 Azithromycin ends today 5/23 Continue ceftriaxone through 5/25   Shock, multifactorial, likely contribution of sedating medications plus sepsis Continue to wean norepinephrine as able  AKI, hyperkalemia-serum creatinine improved, remains hyperkalemic Tight glucose control Follow urine output and BMP Tolerating diuresis, continue on 5/23 and dose daily depending on hemodynamics, renal function   Hyperglycemia-   Continue sliding-scale insulin resistant scale Continue Levemir 20 units twice daily  Hx HTN-  Home antihypertensives on hold  Need for sedation for mechanical ventilation Continue fentanyl, midazolam, wean as able Plan to come off paralytics 5/23   Best practice:  Diet: start TF Pain/Anxiety/Delirium protocol (if indicated): as above VAP protocol (if indicated): yes DVT prophylaxis: sub q hep GI prophylaxis: PPI Glucose control: SSI, levemir Mobility: bed rest Code Status: full Family Communication: called wife, no answer, left message again on 5/23 Disposition: remain in ICU  The patient is critically ill with multiple organ systems failure and requires high complexity decision making for assessment and support, frequent evaluation and titration of therapies, application of advanced monitoring technologies and extensive interpretation of multiple databases. Critical Care Time devoted to patient care services described in this note independent of APP/resident time (if applicable)  is 33 minutes.    Baltazar Apo, MD, PhD 12/20/2019, 8:38 AM [facility] [phone] or if no answer [phone]

## 2019-12-20 NOTE — Progress Notes (Signed)
Daughter was updated on patient status and plan of care. All questions were answered at this time.

## 2019-12-21 ENCOUNTER — Inpatient Hospital Stay (HOSPITAL_COMMUNITY): Payer: 59

## 2019-12-21 LAB — GLUCOSE, CAPILLARY
Glucose-Capillary: 130 mg/dL — ABNORMAL HIGH (ref 70–99)
Glucose-Capillary: 122 mg/dL — ABNORMAL HIGH (ref 70–99)
Glucose-Capillary: 161 mg/dL — ABNORMAL HIGH (ref 70–99)
Glucose-Capillary: 178 mg/dL — ABNORMAL HIGH (ref 70–99)
Glucose-Capillary: 160 mg/dL — ABNORMAL HIGH (ref 70–99)
Glucose-Capillary: 159 mg/dL — ABNORMAL HIGH (ref 70–99)

## 2019-12-21 LAB — PNEUMOCYSTIS JIROVECI SMEAR BY DFA: Pneumocystis jiroveci Ag: NEGATIVE

## 2019-12-21 LAB — BASIC METABOLIC PANEL
Anion gap: 6 (ref 5–15)
BUN: 32 mg/dL — ABNORMAL HIGH (ref 8–23)
CO2: 33 mmol/L — ABNORMAL HIGH (ref 22–32)
Calcium: 7.8 mg/dL — ABNORMAL LOW (ref 8.9–10.3)
Chloride: 112 mmol/L — ABNORMAL HIGH (ref 98–111)
Creatinine, Ser: 0.87 mg/dL (ref 0.61–1.24)
GFR calc Af Amer: >60 mL/min (ref >60)
GFR calc non Af Amer: >60 mL/min (ref >60)
Glucose, Bld: 153 mg/dL — ABNORMAL HIGH (ref 70–99)
Potassium: 4.3 mmol/L (ref 3.5–5.1)
Sodium: 151 mmol/L — ABNORMAL HIGH (ref 135–145)

## 2019-12-21 LAB — MAGNESIUM: Magnesium: 2.6 mg/dL — ABNORMAL HIGH (ref 1.7–2.4)

## 2019-12-21 LAB — CBC
HCT: 41.8 % (ref 39.0–52.0)
Hemoglobin: 13.1 g/dL (ref 13.0–17.0)
MCH: 30.8 pg (ref 26.0–34.0)
MCHC: 31.3 g/dL (ref 30.0–36.0)
MCV: 98.1 fL (ref 80.0–100.0)
Platelets: 108 10*3/uL — ABNORMAL LOW (ref 150–400)
RBC: 4.26 MIL/uL (ref 4.22–5.81)
RDW: 12.9 % (ref 11.5–15.5)
WBC: 3.2 10*3/uL — ABNORMAL LOW (ref 4.0–10.5)
nRBC: 0 % (ref 0.0–0.2)

## 2019-12-21 LAB — PHOSPHORUS: Phosphorus: 3.7 mg/dL (ref 2.5–4.6)

## 2019-12-21 LAB — CYTOLOGY - NON PAP

## 2019-12-21 MED ORDER — DEXTROSE 5 % IV SOLN: INTRAVENOUS | Status: DC

## 2019-12-21 MED ORDER — FUROSEMIDE 10 MG/ML IJ SOLN
40.0000 mg | Freq: Two times a day (BID) | INTRAMUSCULAR | Status: DC
  Administered 2019-12-21 – 2019-12-25 (×9): 40 mg via INTRAVENOUS
  Filled 2019-12-21 (×9): qty 4

## 2019-12-21 MED ORDER — PANTOPRAZOLE SODIUM 40 MG PO PACK
40.0000 mg | PACK | Freq: Every day | ORAL | Status: DC
  Administered 2019-12-21 – 2020-01-01 (×12): 40 mg
  Filled 2019-12-21 (×13): qty 20

## 2019-12-21 NOTE — Progress Notes (Addendum)
NAME:  Bradley Chambers, MRN:  [id number], DOB:  1955-10-16, LOS: 4 ADMISSION DATE:  12/07/2019, CONSULTATION DATE:  5/20 REFERRING MD:  Lanney Gins, CHIEF COMPLAINT:  Dyspnea   Brief History   64 y/o male admitted for severe acute respiratory failure with hypoxemia on 5/18 to [facility], required intubation on 5/20 and tranfer to [facility] COVID+  Past Medical History  Hypertension Hyperlipidemia Obesity  Significant Hospital Events   5/18 came to [facility] ED, admitted to ICU off/on BIPAP, treated with remdesivir, decadron, ivermectin 5/20 intubated for severe acute respiratory failure with hypoxemia and tx to [facility], s/p Bronch 5/22 Started paralytics 5/23 Off pressors  Consults:  PCCM  Procedures:  5/20 ETT >  5/20 R IJ CVL >   Significant Diagnostic Tests:  5/18 CT angiogram chest > bilateral upper lobe predominant ground glass opacification and some patchy lower lobe consolidation, reactive lymphadenopathy, no PE  5/19 TTE> LVEF 55-60%, LV normal size/function, RV systolic function normal, RV size enlarged, moderately elevated PA pressure, left atrium mildly dilated, no mitral regurgitation  5/20 bronchoscopy  Micro Data:  5/19 SARS CoV2 Ag > negative 5/19 SARS CoV2 PCR > negative 5/19 SARS CoV2 Ab > negative 5/18 Blood > negative 5/18 blood > negative 5/18 CMV IgM > negative 5/18 EBV IgG > elevated 5/18 EBV IgM > negative 5/18 HIV > negative 5/20 BAL bacterial culture >  5/20 BAL fungal culture > negative 5/20 BAL aspergillus antigen > 0.05 5/20 BAL PJP > pedning 5/20 BAL respiratory viral panel >> negative 5/20 BAL SARS CoV 2 > Postive 5/20 Nasal SARS CoV 2 > positive 5/20 serum fungitel> negative  Antimicrobials/COVID Rx  5/17 zosyn x1 , 5/20  5/17 vanc x1, 5/20 > 5/19 Ivermectin > 5/20 Azithromycin 5/21 >> 5/23 Ceftriaxone 5/21>> Tocilizumab 5/21  Remdesivir 5/20 >> 5/24 Decadron 5/18 >>  Interim history/subjective:   No acute events overnight.  Remains off  pressors   Objective   Blood pressure (!) 168/81, pulse 75, temperature 99.2 F (37.3 C), temperature source Oral, resp. rate (!) 32, height _0  (1.651 m), weight 95.2 kg, SpO2 (!) 89 %.    Vent Mode: PRVC FiO2 (%):  [50 %-100 %] 70 % Set Rate:  [32 bmp] 32 bmp Vt Set:  [370 mL] 370 mL PEEP:  [14 cmH20] 14 cmH20 Plateau Pressure:  [25 cmH20-27 cmH20] 26 cmH20   Intake/Output Summary (Last 24 hours) at 12/21/2019 5397 Last data filed at 12/21/2019 6734 Gross per 24 hour  Intake 3605.74 ml  Output 3820 ml  Net -214.26 ml   Filed Weights   12/19/19 0339 12/20/19 0400 12/21/19 0402  Weight: 91 kg 91.9 kg 95.2 kg    Examination: Gen:      No acute distress, obese HEENT:  EOMI, sclera anicteric Neck:     No masses; no thyromegaly, ETT Lungs:    Clear to auscultation bilaterally; normal respiratory effort CV:         Regular rate and rhythm; no murmurs Abd:      + bowel sounds; soft, non-tender; no palpable masses, no distension Ext:    No edema; adequate peripheral perfusion Skin:      Warm and dry; no rash Neuro: Sedated, unresponsive  Labs significant for sodium 151, creatinine 0.87 Chest x-ray 5/24-stable bilateral airspace disease    Resolved Hospital Problem list   AKI, hyperkalemia  Assessment & Plan:  Acute hypoxemic respiratory failure due to COVID ARDS Corticosteroids, plan 10 days Remdesivir, plan 5 days Tocilizumab given  Continue low tidal volume ventilation Weaning off paralytics today. Has finished her course of azithromycin.  Ceftriaxone to end tomorrow 5/25 Continue Lasix 40 mg twice daily  Shock, multifactorial, likely contribution of sedating medications plus sepsis Off pressors  Hyper natremia Start D5 water, continue free water via tube  Hyperglycemia-  Continue sliding-scale insulin resistant scale Continue Levemir 20 units twice daily  Hx HTN-  Home antihypertensives on hold  Need for sedation for mechanical ventilation Continue  fentanyl, midazolam, wean as able  Best practice:  Diet: start TF Pain/Anxiety/Delirium protocol (if indicated): as above VAP protocol (if indicated): yes DVT prophylaxis: sub q hep GI prophylaxis: PPI Glucose control: SSI, levemir Mobility: bed rest Code Status: full Family Communication: Wife updated 5/24 Disposition: remain in ICU  The patient is critically ill with multiple organ system failure and requires high complexity decision making for assessment and support, frequent evaluation and titration of therapies, advanced monitoring, review of radiographic studies and interpretation of complex data.   Critical Care Time devoted to patient care services, exclusive of separately billable procedures, described in this note is 45 minutes.   Marshell Garfinkel MD [facility] Please see [email or web address] for pager details.  12/21/2019, 9:41 AM

## 2019-12-22 LAB — GLUCOSE, CAPILLARY
Glucose-Capillary: 129 mg/dL — ABNORMAL HIGH (ref 70–99)
Glucose-Capillary: 148 mg/dL — ABNORMAL HIGH (ref 70–99)
Glucose-Capillary: 158 mg/dL — ABNORMAL HIGH (ref 70–99)
Glucose-Capillary: 159 mg/dL — ABNORMAL HIGH (ref 70–99)
Glucose-Capillary: 169 mg/dL — ABNORMAL HIGH (ref 70–99)
Glucose-Capillary: 182 mg/dL — ABNORMAL HIGH (ref 70–99)

## 2019-12-22 LAB — CBC
HCT: 44.2 % (ref 39.0–52.0)
Hemoglobin: 13.7 g/dL (ref 13.0–17.0)
MCH: 30.9 pg (ref 26.0–34.0)
MCHC: 31 g/dL (ref 30.0–36.0)
MCV: 99.5 fL (ref 80.0–100.0)
Platelets: 136 10*3/uL — ABNORMAL LOW (ref 150–400)
RBC: 4.44 MIL/uL (ref 4.22–5.81)
RDW: 12.9 % (ref 11.5–15.5)
WBC: 5.8 10*3/uL (ref 4.0–10.5)
nRBC: 0.3 % — ABNORMAL HIGH (ref 0.0–0.2)

## 2019-12-22 LAB — BASIC METABOLIC PANEL
Anion gap: 7 (ref 5–15)
BUN: 29 mg/dL — ABNORMAL HIGH (ref 8–23)
CO2: 36 mmol/L — ABNORMAL HIGH (ref 22–32)
Calcium: 8 mg/dL — ABNORMAL LOW (ref 8.9–10.3)
Chloride: 106 mmol/L (ref 98–111)
Creatinine, Ser: 0.83 mg/dL (ref 0.61–1.24)
GFR calc Af Amer: >60 mL/min (ref >60)
GFR calc non Af Amer: >60 mL/min (ref >60)
Glucose, Bld: 167 mg/dL — ABNORMAL HIGH (ref 70–99)
Potassium: 4.9 mmol/L (ref 3.5–5.1)
Sodium: 149 mmol/L — ABNORMAL HIGH (ref 135–145)

## 2019-12-22 LAB — PHOSPHORUS: Phosphorus: 4.2 mg/dL (ref 2.5–4.6)

## 2019-12-22 LAB — MAGNESIUM: Magnesium: 2.5 mg/dL — ABNORMAL HIGH (ref 1.7–2.4)

## 2019-12-22 NOTE — Progress Notes (Signed)
NAME:  Bradley Chambers, MRN:  [id number], DOB:  [birth year], LOS: 5 ADMISSION DATE:  12/03/2019, CONSULTATION DATE:  5/20 REFERRING MD:  Lanney Gins, CHIEF COMPLAINT:  Dyspnea   Brief History   64 y/o male admitted for severe acute respiratory failure with hypoxemia on 5/18 to [facility], required intubation on 5/20 and tranfer to [facility] COVID+  Past Medical History  Hypertension Hyperlipidemia Obesity  Significant Hospital Events   5/18 came to [facility] ED, admitted to ICU off/on BIPAP, treated with remdesivir, decadron, ivermectin 5/20 intubated for severe acute respiratory failure with hypoxemia and tx to [facility], s/p Bronch 5/22 Started paralytics 5/23 Off pressors  Consults:  PCCM  Procedures:  5/20 ETT >  5/20 R IJ CVL >   Significant Diagnostic Tests:  5/18 CT angiogram chest > bilateral upper lobe predominant ground glass opacification and some patchy lower lobe consolidation, reactive lymphadenopathy, no PE  5/19 TTE> LVEF 55-60%, LV normal size/function, RV systolic function normal, RV size enlarged, moderately elevated PA pressure, left atrium mildly dilated, no mitral regurgitation  5/20 bronchoscopy  Micro Data:  5/19 SARS CoV2 Ag > negative 5/19 SARS CoV2 PCR > negative 5/19 SARS CoV2 Ab > negative 5/18 Blood > negative 5/18 blood > negative 5/18 CMV IgM > negative 5/18 EBV IgG > elevated 5/18 EBV IgM > negative 5/18 HIV > negative 5/20 BAL bacterial culture > neg 5/20 BAL fungal culture > negative 5/20 BAL aspergillus antigen > 0.05 5/20 BAL PJP > neg 5/20 BAL respiratory viral panel >> negative 5/20 BAL SARS CoV 2 > Postive 5/20 Nasal SARS CoV 2 > positive 5/20 serum fungitel> negative  Antimicrobials/COVID Rx  5/17 zosyn x1 , 5/20  5/17 vanc x1, 5/20 > 5/19 Ivermectin > 5/20 Azithromycin 5/21 >> 5/23 Ceftriaxone 5/21>> Tocilizumab 5/21  Remdesivir 5/20 >> 5/24 Decadron 5/18 >>  Interim history/subjective:  5/25: required low dose norepi overnight. Off  this am. Otherwise unable to wean vent, still on escalated settings 95%/14 5/24:No acute events overnight.  Remains off pressors   Objective   Blood pressure 127/61, pulse 79, temperature (!) 100.4 F (38 C), resp. rate (!) 36, height _0  (1.651 m), weight 95.2 kg, SpO2 92 %.    Vent Mode: PRVC FiO2 (%):  [70 %-100 %] 95 % Set Rate:  [32 bmp] 32 bmp Vt Set:  [370 mL] 370 mL PEEP:  [14 cmH20] 14 cmH20 Plateau Pressure:  [22 cmH20-28 cmH20] 28 cmH20   Intake/Output Summary (Last 24 hours) at 12/22/2019 1027 Last data filed at 12/22/2019 1000 Gross per 24 hour  Intake 2646.36 ml  Output 5125 ml  Net -2478.64 ml   Filed Weights   12/19/19 0339 12/20/19 0400 12/21/19 0402  Weight: 91 kg 91.9 kg 95.2 kg    Examination: Gen:      No acute distress, obese, sedated unresponsive HEENT:  Perrla, mmmp, sclera anicteric Neck:     No masses; no thyromegaly, ETT Lungs:    Clear to auscultation bilaterally; normal respiratory effort CV:         Regular rate and rhythm; no murmurs Abd:      + bowel sounds; soft, non-tender; no palpable masses, protuberant Ext:    + edema b/l le; adequate peripheral perfusion Skin:      Warm and dry; no rash Neuro: Sedated, unresponsive, no response to verbal or painful stim.   Labs significant for sodium 151->149, creatinine 0.87->0.83, plts 136 Chest x-ray 5/24-stable bilateral airspace disease    Resolved Hospital Problem  list   AKI, hyperkalemia  Assessment & Plan:  Acute hypoxemic respiratory failure due to COVID ARDS Corticosteroids, plan 10 days -completed 5 days of Remdesivir -completed Tocilizumab -Continue low tidal volume ventilation, slow to no wean today  -Completed azithro -ctx to end 5/25 Continue Lasix 40 mg twice daily -intermediate sq heparin dosing for elevated ddimer and covid 19 infection  Shock: multifactorial -resolved at this time.   Hyper natremia -cont D5 water -continue free water via tube  dm2 with  Hyperglycemia: Continue sliding-scale insulin resistant scale Continue Levemir 20 units twice daily  Hx HTN-  Home antihypertensives on hold  Need for sedation for mechanical ventilation Continue fentanyl, midazolam, wean as able  Thrombocytopenia:  -2/2 acute illness -improving today.   Best practice:  Diet: start TF Pain/Anxiety/Delirium protocol (if indicated): as above VAP protocol (if indicated): yes DVT prophylaxis: sub q hep GI prophylaxis: PPI Glucose control: SSI, levemir Mobility: bed rest Code Status: full Family Communication: Wife updated 5/24 Disposition: remain in ICU  Critical care time: The patient is critically ill with multiple organ systems failure and requires high complexity decision making for assessment and support, frequent evaluation and titration of therapies, application of advanced monitoring technologies and extensive interpretation of multiple databases.  Critical care time 37 mins. This represents my time independent of the NPs time taking care of the pt. This is excluding procedures.    [place] 12/22/2019, 10:30 AM

## 2019-12-23 ENCOUNTER — Inpatient Hospital Stay (HOSPITAL_COMMUNITY): Payer: 59

## 2019-12-23 LAB — CBC
HCT: 41.1 % (ref 39.0–52.0)
Hemoglobin: 12.9 g/dL — ABNORMAL LOW (ref 13.0–17.0)
MCH: 30.9 pg (ref 26.0–34.0)
MCHC: 31.4 g/dL (ref 30.0–36.0)
MCV: 98.3 fL (ref 80.0–100.0)
Platelets: 122 10*3/uL — ABNORMAL LOW (ref 150–400)
RBC: 4.18 MIL/uL — ABNORMAL LOW (ref 4.22–5.81)
RDW: 13 % (ref 11.5–15.5)
WBC: 7.1 10*3/uL (ref 4.0–10.5)
nRBC: 0.4 % — ABNORMAL HIGH (ref 0.0–0.2)

## 2019-12-23 LAB — BASIC METABOLIC PANEL
Anion gap: 5 (ref 5–15)
BUN: 27 mg/dL — ABNORMAL HIGH (ref 8–23)
CO2: 38 mmol/L — ABNORMAL HIGH (ref 22–32)
Calcium: 8.2 mg/dL — ABNORMAL LOW (ref 8.9–10.3)
Chloride: 102 mmol/L (ref 98–111)
Creatinine, Ser: 0.73 mg/dL (ref 0.61–1.24)
GFR calc Af Amer: >60 mL/min (ref >60)
GFR calc non Af Amer: >60 mL/min (ref >60)
Glucose, Bld: 233 mg/dL — ABNORMAL HIGH (ref 70–99)
Potassium: 5 mmol/L (ref 3.5–5.1)
Sodium: 145 mmol/L (ref 135–145)

## 2019-12-23 LAB — GLUCOSE, CAPILLARY
Glucose-Capillary: 189 mg/dL — ABNORMAL HIGH (ref 70–99)
Glucose-Capillary: 228 mg/dL — ABNORMAL HIGH (ref 70–99)
Glucose-Capillary: 227 mg/dL — ABNORMAL HIGH (ref 70–99)
Glucose-Capillary: 193 mg/dL — ABNORMAL HIGH (ref 70–99)
Glucose-Capillary: 145 mg/dL — ABNORMAL HIGH (ref 70–99)

## 2019-12-23 LAB — PHOSPHORUS: Phosphorus: 2.6 mg/dL (ref 2.5–4.6)

## 2019-12-23 LAB — MAGNESIUM: Magnesium: 2.4 mg/dL (ref 1.7–2.4)

## 2019-12-23 MED ORDER — FUROSEMIDE 10 MG/ML IJ SOLN
40.0000 mg | Freq: Once | INTRAMUSCULAR | Status: AC
  Administered 2019-12-23: 40 mg via INTRAVENOUS
  Filled 2019-12-23: qty 4

## 2019-12-23 MED ORDER — DOCUSATE SODIUM 50 MG/5ML PO LIQD
100.0000 mg | Freq: Two times a day (BID) | ORAL | Status: DC
  Administered 2019-12-23 – 2020-01-02 (×14): 100 mg
  Filled 2019-12-23 (×15): qty 10

## 2019-12-23 NOTE — Progress Notes (Signed)
Pt sats maintaining in high 90's will wean fio2 to 80% at this time from 90.  Down to levo  Cont to wean fio2 and peep for sats >90.

## 2019-12-23 NOTE — Progress Notes (Signed)
NAME:  Bradley Chambers, MRN:  [id number], DOB:  [birth year], LOS: 6 ADMISSION DATE:  12/28/2019, CONSULTATION DATE:  5/20 REFERRING MD:  Lanney Gins, CHIEF COMPLAINT:  Dyspnea   Brief History   64 y/o male admitted for severe acute respiratory failure with hypoxemia on 5/18 to [facility], required intubation on 5/20 and tranfer to [facility] COVID+  Past Medical History  Hypertension Hyperlipidemia Obesity  Significant Hospital Events   5/18 came to [facility] ED, admitted to ICU off/on BIPAP, treated with remdesivir, decadron, ivermectin 5/20 intubated for severe acute respiratory failure with hypoxemia and tx to [facility], s/p Bronch 5/22 Started paralytics 5/23 Off pressors  Consults:  PCCM  Procedures:  5/20 ETT >  5/20 R IJ CVL >   Significant Diagnostic Tests:  5/18 CT angiogram chest > bilateral upper lobe predominant ground glass opacification and some patchy lower lobe consolidation, reactive lymphadenopathy, no PE  5/19 TTE> LVEF 55-60%, LV normal size/function, RV systolic function normal, RV size enlarged, moderately elevated PA pressure, left atrium mildly dilated, no mitral regurgitation  5/20 bronchoscopy  Micro Data:  5/19 SARS CoV2 Ag > negative 5/19 SARS CoV2 PCR > negative 5/19 SARS CoV2 Ab > negative 5/18 Blood > negative 5/18 blood > negative 5/18 CMV IgM > negative 5/18 EBV IgG > elevated 5/18 EBV IgM > negative 5/18 HIV > negative 5/20 BAL bacterial culture > neg 5/20 BAL fungal culture > negative 5/20 BAL aspergillus antigen > 0.05 5/20 BAL PJP > neg 5/20 BAL respiratory viral panel >> negative 5/20 BAL SARS CoV 2 > Postive 5/20 Nasal SARS CoV 2 > positive 5/20 serum fungitel> negative  Antimicrobials/COVID Rx  5/17 zosyn x1 , 5/20  5/17 vanc x1, 5/20 > 5/19 Ivermectin > 5/20 Azithromycin 5/21 >> 5/23 Ceftriaxone 5/21>> Tocilizumab 5/21  Remdesivir 5/20 >> 5/24 Decadron 5/18 >>  Interim history/subjective:  5/26: tmax 100.4, down to 90%/14 on vent.  Remains on low dose norepi as well.  5/25: required low dose norepi overnight. Off this am. Otherwise unable to wean vent, still on escalated settings 95%/14 5/24:No acute events overnight.  Remains off pressors   Objective   Blood pressure (!) 196/79, pulse 77, temperature 100.1 F (37.8 C), temperature source Oral, resp. rate 19, height _0  (1.651 m), weight 95.2 kg, SpO2 91 %.    Vent Mode: PRVC FiO2 (%):  [90 %-95 %] 90 % Set Rate:  [32 bmp] 32 bmp Vt Set:  [370 mL] 370 mL PEEP:  [14 cmH20] 14 cmH20 Plateau Pressure:  [25 IBB04-88 cmH20] 26 cmH20   Intake/Output Summary (Last 24 hours) at 12/23/2019 0737 Last data filed at 12/23/2019 0600 Gross per 24 hour  Intake 3028.27 ml  Output 3420 ml  Net -391.73 ml   Filed Weights   12/19/19 0339 12/20/19 0400 12/21/19 0402  Weight: 91 kg 91.9 kg 95.2 kg    Examination: Gen:      No acute distress, obese, sedated unresponsive HEENT:  Perrla, mmmp, sclera anicteric Neck:     No masses; no thyromegaly, ETT Lungs:    Diminished bilaterally; normal respiratory effort CV:         Regular rate and rhythm; no murmurs Abd:      + bowel sounds; soft, non-tender; no palpable masses, protuberant Ext:    + edema b/l le; adequate peripheral perfusion Skin:      Warm and dry; R forearm with demarcated red discoloration of skin.  Neuro: Sedated, unresponsive, no response to verbal or painful stim.  Labs significant for sodium 151->149->145, creatinine 0.87->0.83->0.7, plts 136->122 Chest x-ray 5/26-worsening bilateral airspace disease R>>L   Resolved Hospital Problem list   AKI, hyperkalemia  Assessment & Plan:  Acute hypoxemic respiratory failure due to COVID ARDS Corticosteroids, plan 10 days -completed 5 days of Remdesivir -completed Tocilizumab -Continue low tidal volume ventilation, slow wean (down 5% from  Yesterday) -Completed azithro -ctx to end 5/25 -Continue Lasix 40 mg twice daily.Marland Kitchen additional 1x today for total of  3 -intermediate sq heparin dosing for elevated ddimer and covid 19 infection  Shock: multifactorial -remains on norepi -titrate to map >65   Hypernatremia -d/c d5w -improving sodium -continue free water via tube  dm2 with Hyperglycemia: Continue sliding-scale insulin resistant scale Continue Levemir 20 units twice daily  Hx HTN-  Home antihypertensives on hold  Need for sedation for mechanical ventilation Continue fentanyl, midazolam, wean as able  Thrombocytopenia:  -2/2 acute illness -relatively stable  Best practice:  Diet: TF Pain/Anxiety/Delirium protocol (if indicated): as above VAP protocol (if indicated): yes DVT prophylaxis: sub q hep GI prophylaxis: PPI Glucose control: SSI, levemir Mobility: bed rest Code Status: full Family Communication: Wife updated 5/26 Disposition: remain in ICU  Critical care time: The patient is critically ill with multiple organ systems failure and requires high complexity decision making for assessment and support, frequent evaluation and titration of therapies, application of advanced monitoring technologies and extensive interpretation of multiple databases.  Critical care time 39 mins. This represents my time independent of the NPs time taking care of the pt. This is excluding procedures.    [place] [facility] 12/23/2019, 7:37 AM

## 2019-12-24 ENCOUNTER — Inpatient Hospital Stay (HOSPITAL_COMMUNITY): Payer: 59

## 2019-12-24 LAB — GLUCOSE, CAPILLARY
Glucose-Capillary: 162 mg/dL — ABNORMAL HIGH (ref 70–99)
Glucose-Capillary: 178 mg/dL — ABNORMAL HIGH (ref 70–99)
Glucose-Capillary: 204 mg/dL — ABNORMAL HIGH (ref 70–99)
Glucose-Capillary: 209 mg/dL — ABNORMAL HIGH (ref 70–99)
Glucose-Capillary: 232 mg/dL — ABNORMAL HIGH (ref 70–99)
Glucose-Capillary: 241 mg/dL — ABNORMAL HIGH (ref 70–99)
Glucose-Capillary: 250 mg/dL — ABNORMAL HIGH (ref 70–99)

## 2019-12-24 MED ORDER — DOCUSATE SODIUM 50 MG/5ML PO LIQD: 100.0000 mg | Freq: Two times a day (BID) | ORAL | Status: DC | PRN

## 2019-12-24 MED ORDER — POLYETHYLENE GLYCOL 3350 17 G PO PACK
17.0000 g | PACK | Freq: Every day | ORAL | Status: DC
  Administered 2019-12-26 – 2020-01-01 (×6): 17 g
  Filled 2019-12-24 (×6): qty 1

## 2019-12-24 MED ORDER — POLYETHYLENE GLYCOL 3350 17 G PO PACK: 17.0000 g | PACK | Freq: Every day | ORAL | Status: DC | PRN

## 2019-12-24 NOTE — Progress Notes (Signed)
Nutrition Follow-up  DOCUMENTATION CODES:   Obesity unspecified  INTERVENTION:   Tube Feeding via OG tube:  Increase to Vital 1.5 at 60 ml/hr Pro-Stat 30 mL BID Provides 127 g of protein, 2360 kcals and 1094 mL of free water Meets 100% estimated calorie and protein needs   NUTRITION DIAGNOSIS:   Increased nutrient needs related to acute illness as evidenced by estimated needs.  Being addressed via TF   GOAL:   Patient will meet greater than or equal to 90% of their needs  Met via TF   MONITOR:   Vent status, Labs, Weight trends, TF tolerance, I & O's  REASON FOR ASSESSMENT:   Ventilator, Consult Enteral/tube feeding initiation and management  ASSESSMENT:   64 year old male who was admitted on 12/15/19 for severe acute respiratory failure with hypoxemia secondary to COVID-19. Pt required intubation on 12/20/2019. PMH of HTN, HLD.   5/18 Admitted 5/20 Intubated, Bronch 5/21 TF initiated  Pt remains on vent support, deeply sedated due to vent dyssynchrony, off pressors  Vital 1.5 at 50 ml/hr, Pro-Stat 30 mL BID via OG tube Free water flush of 400 mL q 4 hours  OG tube with tip beyond GE junction. Unsure where tip terminates but at least enters stomach  Hypernatremia has improved with free water administration  No skin breakdown per RN skin assessment  Admit weight 89.3 kg; current weight 91.1 kg  Labs: CBGs 162-250 (ICU goal 140-180), sodium 143 (wdl), potassium 4.0 (wdl), phosphorus 2.6 (wdl) Meds: decadron, ss novolog, levemir, miralax, colace  Diet Order:   Diet Order            Diet NPO time specified  Diet effective now              EDUCATION NEEDS:   No education needs have been identified at this time  Skin:  Skin Assessment: Reviewed RN Assessment  Last BM:  5/28 rectal tube  Height:   Ht Readings from Last 1 Encounters:  12/21/19 5' 5"  (1.651 m)    Weight:   Wt Readings from Last 1 Encounters:  12/25/19 91.1 kg    Ideal Body  Weight:  61.8 kg  BMI:  Body mass index is 33.42 kg/m.  Estimated Nutritional Needs:   Kcal:  2000-2400 kcals  Protein:  110-130 grams  Fluid:  >/= 2.0 L   Kerman Passey MS, RDN, LDN, CNSC RD Pager Number and RD On-Call Pager Number Located in [place]

## 2019-12-24 NOTE — Progress Notes (Signed)
NAME:  Bradley Chambers, MRN:  [id number], DOB:  29-Jan-1956, LOS: 7 ADMISSION DATE:  12/21/2019, CONSULTATION DATE:  5/20 REFERRING MD:  Lanney Gins, CHIEF COMPLAINT:  Dyspnea   Brief History   64 y/o male admitted for severe acute respiratory failure with hypoxemia on 5/18 to [facility], required intubation on 5/20 and tranfer to [facility] COVID+  Past Medical History  Hypertension Hyperlipidemia Obesity  Significant Hospital Events   5/18 came to [facility] ED, admitted to ICU off/on BIPAP, treated with remdesivir, decadron, ivermectin 5/20 intubated for severe acute respiratory failure with hypoxemia and tx to [facility], s/p Bronch 5/22 Started paralytics 5/23 Off pressors  Consults:  PCCM  Procedures:  5/20 ETT >  5/20 R IJ CVL >   Significant Diagnostic Tests:  5/18 CT angiogram chest > bilateral upper lobe predominant ground glass opacification and some patchy lower lobe consolidation, reactive lymphadenopathy, no PE  5/19 TTE> LVEF 55-60%, LV normal size/function, RV systolic function normal, RV size enlarged, moderately elevated PA pressure, left atrium mildly dilated, no mitral regurgitation  5/20 bronchoscopy  Micro Data:  5/19 SARS CoV2 Ag > negative 5/19 SARS CoV2 PCR > negative 5/19 SARS CoV2 Ab > negative 5/18 Blood > negative 5/18 blood > negative 5/18 CMV IgM > negative 5/18 EBV IgG > elevated 5/18 EBV IgM > negative 5/18 HIV > negative 5/20 BAL bacterial culture > neg 5/20 BAL fungal culture > negative 5/20 BAL aspergillus antigen > 0.05 5/20 BAL PJP > neg 5/20 BAL respiratory viral panel >> negative 5/20 BAL SARS CoV 2 > Postive 5/20 Nasal SARS CoV 2 > positive 5/20 serum fungitel> negative  Antimicrobials/COVID Rx  5/17 zosyn x1 , 5/20  5/17 vanc x1, 5/20 > 5/19 Ivermectin > 5/20 Azithromycin 5/21 >> 5/23 Ceftriaxone 5/21>> Tocilizumab 5/21  Remdesivir 5/20 >> 5/24 Decadron 5/18 >>  Interim history/subjective:  5/27: personally weaned pt to 80/14 yesterday.  Despite oxygen staying in the mid to high 90's all evening he remains on this. Attempted wean this am and desat to mid to low 80's so placed back on 80%. Will retry later.  tmax 100.8. off pressor this am.  5/26: tmax 100.4, down to 90%/14 on vent. Remains on low dose norepi as well.  5/25: required low dose norepi overnight. Off this am. Otherwise unable to wean vent, still on escalated settings 95%/14 5/24:No acute events overnight.  Remains off pressors   Objective   Blood pressure (!) 152/63, pulse 71, temperature 99.3 F (37.4 C), temperature source Axillary, resp. rate (!) 28, height _0  (1.651 m), weight 95.2 kg, SpO2 93 %.    Vent Mode: PRVC FiO2 (%):  [80 %-90 %] 80 % Set Rate:  [32 bmp] 32 bmp Vt Set:  [370 mL] 370 mL PEEP:  [14 cmH20] 14 cmH20 Plateau Pressure:  [20 cmH20-26 cmH20] 26 cmH20   Intake/Output Summary (Last 24 hours) at 12/24/2019 0741 Last data filed at 12/24/2019 0400 Gross per 24 hour  Intake 1676.89 ml  Output 3740 ml  Net -2063.11 ml   Filed Weights   12/19/19 0339 12/20/19 0400 12/21/19 0402  Weight: 91 kg 91.9 kg 95.2 kg    Examination: Gen:      No acute distress, obese, sedated unresponsive HEENT:  Perrla, mmmp, sclera anicteric Neck:     No masses; no thyromegaly, ETT Lungs:    Diminished bilaterally; normal respiratory effort with vent CV:         Regular rate and rhythm; no murmurs Abd:      +  bowel sounds; soft, non-tender; no palpable masses, protuberant Ext:    + edema b/l le; adequate peripheral perfusion Skin:      Warm and dry; R forearm with demarcated red discoloration of skin.  Neuro: Sedated, unresponsive, no response to verbal or painful stim.   Labs holiday today. Recheck in am 5/28 Chest x-ray 5/27-improved R sided aeration, personally reviewed by me   Resolved Hospital Problem list   AKI, hyperkalemia  Assessment & Plan:  Acute hypoxemic respiratory failure due to COVID ARDS Corticosteroids, plan 10 days -completed 5  days of Remdesivir -completed Tocilizumab -Continue low tidal volume ventilation, cont wean for sats >90% -will wean today again. Unclear why he was not weaned overnight with adequate sats.  -Completed azithro -ctx to end 5/25 -Continue Lasix 40 mg twice daily.Marland Kitchen additional 1x today for total of 3 -intermediate sq heparin dosing for elevated ddimer and covid 19 infection  Shock: multifactorial -improved off pressors   Hypernatremia -improving sodium -lab holiday today but recheck in am.  -remains on  free water via tube  dm2 with Hyperglycemia: Continue sliding-scale insulin resistant scale Continue Levemir 20 units twice daily  Hx HTN-  Home antihypertensives on hold  Need for sedation for mechanical ventilation Continue fentanyl, midazolam, wean as able  Thrombocytopenia:  -2/2 acute illness -relatively stable  Best practice:  Diet: TF Pain/Anxiety/Delirium protocol (if indicated): as above VAP protocol (if indicated): yes DVT prophylaxis: sub q hep GI prophylaxis: PPI Glucose control: SSI, levemir Mobility: bed rest Code Status: full Family Communication: Wife updated 5/27 via phone Disposition: remain in ICU  Critical care time: The patient is critically ill with multiple organ systems failure and requires high complexity decision making for assessment and support, frequent evaluation and titration of therapies, application of advanced monitoring technologies and extensive interpretation of multiple databases.  Critical care time 36 mins. This represents my time independent of the NPs time taking care of the pt. This is excluding procedures.    [place] [facility] 12/24/2019, 7:41 AM

## 2019-12-25 LAB — MAGNESIUM: Magnesium: 2.6 mg/dL — ABNORMAL HIGH (ref 1.7–2.4)

## 2019-12-25 LAB — CBC
HCT: 38.2 % — ABNORMAL LOW (ref 39.0–52.0)
Hemoglobin: 11.9 g/dL — ABNORMAL LOW (ref 13.0–17.0)
MCH: 31.2 pg (ref 26.0–34.0)
MCHC: 31.2 g/dL (ref 30.0–36.0)
MCV: 100 fL (ref 80.0–100.0)
Platelets: 117 10*3/uL — ABNORMAL LOW (ref 150–400)
RBC: 3.82 MIL/uL — ABNORMAL LOW (ref 4.22–5.81)
RDW: 13.5 % (ref 11.5–15.5)
WBC: 10.1 10*3/uL (ref 4.0–10.5)
nRBC: 0.2 % (ref 0.0–0.2)

## 2019-12-25 LAB — COMPREHENSIVE METABOLIC PANEL
ALT: 45 U/L — ABNORMAL HIGH (ref 0–44)
AST: 38 U/L (ref 15–41)
Albumin: 2.4 g/dL — ABNORMAL LOW (ref 3.5–5.0)
Alkaline Phosphatase: 44 U/L (ref 38–126)
Anion gap: 6 (ref 5–15)
BUN: 42 mg/dL — ABNORMAL HIGH (ref 8–23)
CO2: 41 mmol/L — ABNORMAL HIGH (ref 22–32)
Calcium: 8.2 mg/dL — ABNORMAL LOW (ref 8.9–10.3)
Chloride: 96 mmol/L — ABNORMAL LOW (ref 98–111)
Creatinine, Ser: 0.78 mg/dL (ref 0.61–1.24)
GFR calc Af Amer: >60 mL/min (ref >60)
GFR calc non Af Amer: >60 mL/min (ref >60)
Glucose, Bld: 191 mg/dL — ABNORMAL HIGH (ref 70–99)
Potassium: 4.4 mmol/L (ref 3.5–5.1)
Sodium: 143 mmol/L (ref 135–145)
Total Bilirubin: 0.5 mg/dL (ref 0.3–1.2)
Total Protein: 5.5 g/dL — ABNORMAL LOW (ref 6.5–8.1)

## 2019-12-25 LAB — GLUCOSE, CAPILLARY
Glucose-Capillary: 153 mg/dL — ABNORMAL HIGH (ref 70–99)
Glucose-Capillary: 191 mg/dL — ABNORMAL HIGH (ref 70–99)
Glucose-Capillary: 197 mg/dL — ABNORMAL HIGH (ref 70–99)
Glucose-Capillary: 208 mg/dL — ABNORMAL HIGH (ref 70–99)
Glucose-Capillary: 242 mg/dL — ABNORMAL HIGH (ref 70–99)
Glucose-Capillary: 269 mg/dL — ABNORMAL HIGH (ref 70–99)

## 2019-12-25 MED ORDER — OXYCODONE HCL 5 MG/5ML PO SOLN
5.0000 mg | ORAL | Status: DC
  Administered 2019-12-25 – 2020-01-02 (×49): 5 mg
  Filled 2019-12-25 (×49): qty 5

## 2019-12-25 MED ORDER — CLONAZEPAM 1 MG PO TABS
1.0000 mg | ORAL_TABLET | Freq: Two times a day (BID) | ORAL | Status: DC
  Administered 2019-12-25 – 2019-12-27 (×6): 1 mg
  Filled 2019-12-25 (×6): qty 1

## 2019-12-25 MED ORDER — FUROSEMIDE 10 MG/ML IJ SOLN: 40.0000 mg | Freq: Every day | INTRAMUSCULAR | Status: DC

## 2019-12-25 MED ORDER — INSULIN ASPART 100 UNIT/ML ~~LOC~~ SOLN
3.0000 [IU] | SUBCUTANEOUS | Status: DC
  Administered 2019-12-25 – 2020-01-02 (×48): 3 [IU] via SUBCUTANEOUS

## 2019-12-25 MED ORDER — VITAL 1.5 CAL PO LIQD
1000.0000 mL | ORAL | Status: DC
  Administered 2019-12-25 – 2020-01-01 (×7): 1000 mL
  Filled 2019-12-25 (×13): qty 1000

## 2019-12-25 NOTE — Progress Notes (Addendum)
NAME:  Bradley Chambers, MRN:  [id number], DOB:  1955/11/01, LOS: 52 ADMISSION DATE:  11/28/2019, CONSULTATION DATE:  5/20 REFERRING MD:  Lanney Gins, CHIEF COMPLAINT:  Dyspnea   Brief History   64 y/o male admitted for severe acute respiratory failure with hypoxemia on 5/18 to [facility], required intubation on 5/20 and tranfer to [facility] COVID+  Past Medical History  Hypertension Hyperlipidemia Obesity  Significant Hospital Events   5/18 came to [facility] ED, admitted to ICU off/on BIPAP, treated with remdesivir, decadron, ivermectin 5/20 intubated for severe acute respiratory failure with hypoxemia and tx to [facility], s/p Bronch 5/22 Started paralytics 5/23 Off pressors  Consults:  PCCM  Procedures:  5/20 ETT >  5/20 R IJ CVL >   Significant Diagnostic Tests:  5/18 CT angiogram chest > bilateral upper lobe predominant ground glass opacification and some patchy lower lobe consolidation, reactive lymphadenopathy, no PE  5/19 TTE> LVEF 55-60%, LV normal size/function, RV systolic function normal, RV size enlarged, moderately elevated PA pressure, left atrium mildly dilated, no mitral regurgitation  5/20 bronchoscopy  Micro Data:  5/19 SARS CoV2 Ag > negative 5/19 SARS CoV2 PCR > negative 5/19 SARS CoV2 Ab > negative 5/18 Blood > negative 5/18 blood > negative 5/18 CMV IgM > negative 5/18 EBV IgG > elevated 5/18 EBV IgM > negative 5/18 HIV > negative 5/20 BAL bacterial culture > neg 5/20 BAL fungal culture > negative 5/20 BAL aspergillus antigen > 0.05 5/20 BAL PJP > neg 5/20 BAL respiratory viral panel >> negative 5/20 BAL SARS CoV 2 > Postive 5/20 Nasal SARS CoV 2 > positive 5/20 serum fungitel> negative  Antimicrobials/COVID Rx  5/17 zosyn x1 , 5/20  5/17 vanc x1, 5/20 > 5/19 Ivermectin > 5/20 Azithromycin 5/21 >> 5/23 Ceftriaxone 5/21>> Tocilizumab 5/21  Remdesivir 5/20 >> 5/24 Decadron 5/18 >>  Interim history/subjective:   Remains on deep sedation, has dyssynchrony  with sedation is lightened Off pressors.  Objective   Blood pressure (!) 109/44, pulse (!) 49, temperature 97.9 F (36.6 C), resp. rate (!) 26, height _0  (1.651 m), weight 91.1 kg, SpO2 96 %.    Vent Mode: PRVC FiO2 (%):  [60 %-70 %] 60 % Set Rate:  [32 bmp] 32 bmp Vt Set:  [370 mL] 370 mL PEEP:  [14 cmH20] 14 cmH20 Plateau Pressure:  [27 cmH20-28 cmH20] 27 cmH20   Intake/Output Summary (Last 24 hours) at 12/25/2019 0803 Last data filed at 12/25/2019 0600 Gross per 24 hour  Intake 3564.88 ml  Output 3070 ml  Net 494.88 ml   Filed Weights   12/20/19 0400 12/21/19 0402 12/25/19 0500  Weight: 91.9 kg 95.2 kg 91.1 kg    Examination: Gen:      No acute distress HEENT:  EOMI, sclera anicteric Neck:     No masses; no thyromegaly, ETT Lungs:    Clear to auscultation bilaterally; normal respiratory effort CV:         Regular rate and rhythm; no murmurs Abd:      + bowel sounds; soft, non-tender; no palpable masses, no distension Ext:    No edema; adequate peripheral perfusion Skin:      Warm and dry; no rash Neuro: Sedated  Labs significant for glucose 242, WBC 10.1 No new imaging  Resolved Hospital Problem list   AKI, hyperkalemia, shock, hypernatremia  Assessment & Plan:  Acute hypoxemic respiratory failure due to COVID ARDS Corticosteroids, plan 10 days -completed 5 days of Remdesivir -completed Tocilizumab -Continue low tidal volume  ventilation, cont wean for sats >90% -Wean PEEP/FiO2 Completed ceftriaxone, azithromycin Change Lasix to 40 mg once daily as he appears euvolemic  DM2 with Hyperglycemia: Continue sliding-scale insulin resistant scale Continue Levemir 20 units twice daily Add tube feed coverage  Hx HTN-  Home antihypertensives on hold  Need for sedation for mechanical ventilation Continue fentanyl, midazolam, wean as able  Thrombocytopenia:  -2/2 acute illness -relatively stable  Best practice:  Diet: TF Pain/Anxiety/Delirium protocol (if  indicated): as above VAP protocol (if indicated): yes DVT prophylaxis: sub q hep GI prophylaxis: PPI Glucose control: SSI, levemir Mobility: bed rest Code Status: full Family Communication: Wife updated 5/28 via phone Disposition: remain in ICU  Critical care time:    The patient is critically ill with multiple organ system failure and requires high complexity decision making for assessment and support, frequent evaluation and titration of therapies, advanced monitoring, review of radiographic studies and interpretation of complex data.   Critical Care Time devoted to patient care services, exclusive of separately billable procedures, described in this note is 35 minutes.   Marshell Garfinkel MD [place] [facility] Please see [email or web address] for pager details.  12/25/2019, 8:24 AM

## 2019-12-26 LAB — CBC
HCT: 38.3 % — ABNORMAL LOW (ref 39.0–52.0)
Hemoglobin: 12 g/dL — ABNORMAL LOW (ref 13.0–17.0)
MCH: 31 pg (ref 26.0–34.0)
MCHC: 31.3 g/dL (ref 30.0–36.0)
MCV: 99 fL (ref 80.0–100.0)
Platelets: 112 10*3/uL — ABNORMAL LOW (ref 150–400)
RBC: 3.87 MIL/uL — ABNORMAL LOW (ref 4.22–5.81)
RDW: 13.5 % (ref 11.5–15.5)
WBC: 12.9 10*3/uL — ABNORMAL HIGH (ref 4.0–10.5)
nRBC: 0.8 % — ABNORMAL HIGH (ref 0.0–0.2)

## 2019-12-26 LAB — GLUCOSE, CAPILLARY
Glucose-Capillary: 218 mg/dL — ABNORMAL HIGH (ref 70–99)
Glucose-Capillary: 205 mg/dL — ABNORMAL HIGH (ref 70–99)
Glucose-Capillary: 213 mg/dL — ABNORMAL HIGH (ref 70–99)
Glucose-Capillary: 177 mg/dL — ABNORMAL HIGH (ref 70–99)
Glucose-Capillary: 187 mg/dL — ABNORMAL HIGH (ref 70–99)
Glucose-Capillary: 185 mg/dL — ABNORMAL HIGH (ref 70–99)

## 2019-12-26 LAB — BASIC METABOLIC PANEL
Anion gap: 8 (ref 5–15)
BUN: 43 mg/dL — ABNORMAL HIGH (ref 8–23)
CO2: 40 mmol/L — ABNORMAL HIGH (ref 22–32)
Calcium: 8.4 mg/dL — ABNORMAL LOW (ref 8.9–10.3)
Chloride: 94 mmol/L — ABNORMAL LOW (ref 98–111)
Creatinine, Ser: 0.77 mg/dL (ref 0.61–1.24)
GFR calc Af Amer: >60 mL/min (ref >60)
GFR calc non Af Amer: >60 mL/min (ref >60)
Glucose, Bld: 184 mg/dL — ABNORMAL HIGH (ref 70–99)
Potassium: 4.6 mmol/L (ref 3.5–5.1)
Sodium: 142 mmol/L (ref 135–145)

## 2019-12-26 MED ORDER — FUROSEMIDE 10 MG/ML IJ SOLN
40.0000 mg | Freq: Two times a day (BID) | INTRAMUSCULAR | Status: DC
  Administered 2019-12-26 – 2019-12-28 (×5): 40 mg via INTRAVENOUS
  Filled 2019-12-26 (×5): qty 4

## 2019-12-26 MED ORDER — LABETALOL HCL 5 MG/ML IV SOLN: 10.0000 mg | INTRAVENOUS | Status: DC | PRN

## 2019-12-26 NOTE — Progress Notes (Signed)
eLink Physician-Brief Progress Note Patient Name: Bradley Chambers DOB: 02-14-1956 MRN: [id number]   Date of Service  12/26/2019  HPI/Events of Note  Pt needs a.m. BMP order.  eICU Interventions  BMP ordered.        Lilyth Lawyer U Teresita Fanton 12/26/2019, 1:25 AM

## 2019-12-26 NOTE — Progress Notes (Signed)
NAME:  Bradley Chambers, MRN:  [id number], DOB:  07-01-1956, LOS: 36 ADMISSION DATE:  12/21/2019, CONSULTATION DATE:  5/20 REFERRING MD:  Lanney Gins, CHIEF COMPLAINT:  Dyspnea   Brief History   64 y/o male admitted for severe acute respiratory failure with hypoxemia on 5/18 to [facility], required intubation on 5/20 and tranfer to [facility] COVID+  Past Medical History  Hypertension Hyperlipidemia Obesity  Significant Hospital Events   5/18 came to [facility] ED, admitted to ICU off/on BIPAP, treated with remdesivir, decadron, ivermectin 5/20 intubated for severe acute respiratory failure with hypoxemia and tx to [facility], s/p Bronch 5/22 Started paralytics 5/23 Off pressors  Consults:  PCCM  Procedures:  5/20 ETT >  5/20 R IJ CVL >   Significant Diagnostic Tests:  5/18 CT angiogram chest > bilateral upper lobe predominant ground glass opacification and some patchy lower lobe consolidation, reactive lymphadenopathy, no PE  5/19 TTE> LVEF 55-60%, LV normal size/function, RV systolic function normal, RV size enlarged, moderately elevated PA pressure, left atrium mildly dilated, no mitral regurgitation  5/20 bronchoscopy  Micro Data:  5/19 SARS CoV2 Ag > negative 5/19 SARS CoV2 PCR > negative 5/19 SARS CoV2 Ab > negative 5/18 Blood > negative 5/18 blood > negative 5/18 CMV IgM > negative 5/18 EBV IgG > elevated 5/18 EBV IgM > negative 5/18 HIV > negative 5/20 BAL bacterial culture > neg 5/20 BAL fungal culture > negative 5/20 BAL aspergillus antigen > 0.05 5/20 BAL PJP > neg 5/20 BAL respiratory viral panel >> negative 5/20 BAL SARS CoV 2 > Postive 5/20 Nasal SARS CoV 2 > positive 5/20 serum fungitel> negative  Antimicrobials/COVID Rx  5/17 zosyn x1 , 5/20  5/17 vanc x1, 5/20 > 5/19 Ivermectin > 5/20 Azithromycin 5/21 >> 5/23 Ceftriaxone 5/21>> Tocilizumab 5/21  Remdesivir 5/20 >> 5/24 Decadron 5/18 >>  Interim history/subjective:     Weaning sedation, No acute events  Objective   Blood pressure (!) 119/53, pulse 61, temperature 99.7 F (37.6 C), resp. rate (!) 25, height _0  (1.651 m), weight 92.3 kg, SpO2 92 %.    Vent Mode: PRVC FiO2 (%):  [60 %] 60 % Set Rate:  [32 bmp] 32 bmp Vt Set:  [370 mL] 370 mL PEEP:  [14 cmH20] 14 cmH20 Plateau Pressure:  [23 cmH20-28 cmH20] 23 cmH20   Intake/Output Summary (Last 24 hours) at 12/26/2019 0820 Last data filed at 12/26/2019 0800 Gross per 24 hour  Intake 4672.91 ml  Output 2400 ml  Net 2272.91 ml   Filed Weights   12/21/19 0402 12/25/19 0500 12/26/19 0400  Weight: 95.2 kg 91.1 kg 92.3 kg    Examination: Gen:      No acute distress HEENT:  EOMI, sclera anicteric Neck:     No masses; no thyromegaly, ETT Lungs:    Clear to auscultation bilaterally; normal respiratory effort CV:         Regular rate and rhythm; no murmurs Abd:      + bowel sounds; soft, non-tender; no palpable masses, no distension Ext:    No edema; adequate peripheral perfusion Skin:      Warm and dry; no rash Neuro: Sedated  Lab significant for glucose 184, BUN/creatinine 42/0.77, WBC 4.9, hemoglobin 12 No new imaging  Resolved Hospital Problem list   AKI, hyperkalemia, shock, hypernatremia  Assessment & Plan:  Acute hypoxemic respiratory failure due to COVID ARDS Corticosteroids, plan 10 days -completed 5 days of Remdesivir -completed Tocilizumab -Continue low tidal volume ventilation, cont wean for  sats >90% -Wean PEEP/FiO2 Completed ceftriaxone, azithromycin Continue Lasix 40 mg bid DC A-line  DM2 with Hyperglycemia: Continue sliding-scale insulin resistant scale Continue Levemir 20 units twice daily Tube feed coverage  Hx HTN-  Home antihypertensives on hold  Need for sedation for mechanical ventilation Continue fentanyl, midazolam, wean as able  Thrombocytopenia:  -2/2 acute illness -relatively stable  Best practice:  Diet: TF Pain/Anxiety/Delirium protocol (if  indicated): Weaning sedation VAP protocol (if indicated): yes DVT prophylaxis: sub q hep GI prophylaxis: PPI Glucose control: SSI, levemir Mobility: bed rest Code Status: full Family Communication: Wife updated 5/28 via phone Disposition: remain in ICU  Critical care time:    The patient is critically ill with multiple organ system failure and requires high complexity decision making for assessment and support, frequent evaluation and titration of therapies, advanced monitoring, review of radiographic studies and interpretation of complex data.   Critical Care Time devoted to patient care services, exclusive of separately billable procedures, described in this note is 35 minutes.   Marshell Garfinkel MD [facility] Please see [email or web address] for pager details.  12/26/2019, 8:20 AM

## 2019-12-27 ENCOUNTER — Inpatient Hospital Stay (HOSPITAL_COMMUNITY): Payer: 59

## 2019-12-27 LAB — CBC
HCT: 37.8 % — ABNORMAL LOW (ref 39.0–52.0)
Hemoglobin: 11.9 g/dL — ABNORMAL LOW (ref 13.0–17.0)
MCH: 31.4 pg (ref 26.0–34.0)
MCHC: 31.5 g/dL (ref 30.0–36.0)
MCV: 99.7 fL (ref 80.0–100.0)
Platelets: 106 10*3/uL — ABNORMAL LOW (ref 150–400)
RBC: 3.79 MIL/uL — ABNORMAL LOW (ref 4.22–5.81)
RDW: 14.3 % (ref 11.5–15.5)
WBC: 12.4 10*3/uL — ABNORMAL HIGH (ref 4.0–10.5)
nRBC: 1.1 % — ABNORMAL HIGH (ref 0.0–0.2)

## 2019-12-27 LAB — BASIC METABOLIC PANEL
Anion gap: 9 (ref 5–15)
BUN: 45 mg/dL — ABNORMAL HIGH (ref 8–23)
CO2: 39 mmol/L — ABNORMAL HIGH (ref 22–32)
Calcium: 8.3 mg/dL — ABNORMAL LOW (ref 8.9–10.3)
Chloride: 91 mmol/L — ABNORMAL LOW (ref 98–111)
Creatinine, Ser: 0.89 mg/dL (ref 0.61–1.24)
GFR calc Af Amer: >60 mL/min (ref >60)
GFR calc non Af Amer: >60 mL/min (ref >60)
Glucose, Bld: 181 mg/dL — ABNORMAL HIGH (ref 70–99)
Potassium: 4.6 mmol/L (ref 3.5–5.1)
Sodium: 139 mmol/L (ref 135–145)

## 2019-12-27 LAB — GLUCOSE, CAPILLARY
Glucose-Capillary: 162 mg/dL — ABNORMAL HIGH (ref 70–99)
Glucose-Capillary: 177 mg/dL — ABNORMAL HIGH (ref 70–99)
Glucose-Capillary: 186 mg/dL — ABNORMAL HIGH (ref 70–99)
Glucose-Capillary: 207 mg/dL — ABNORMAL HIGH (ref 70–99)
Glucose-Capillary: 215 mg/dL — ABNORMAL HIGH (ref 70–99)
Glucose-Capillary: 224 mg/dL — ABNORMAL HIGH (ref 70–99)

## 2019-12-27 MED ORDER — AMLODIPINE BESYLATE 10 MG PO TABS
10.0000 mg | ORAL_TABLET | Freq: Every day | ORAL | Status: DC
  Administered 2019-12-28 – 2020-01-02 (×6): 10 mg via ORAL
  Filled 2019-12-27 (×7): qty 1

## 2019-12-27 MED ORDER — FREE WATER
200.0000 mL | Status: DC
  Administered 2019-12-27 – 2019-12-31 (×24): 200 mL

## 2019-12-27 MED ORDER — DEXMEDETOMIDINE HCL IN NACL 400 MCG/100ML IV SOLN
0.4000 ug/kg/h | INTRAVENOUS | Status: DC
  Administered 2019-12-27 (×2): 0.4 ug/kg/h via INTRAVENOUS
  Administered 2019-12-28: 0.6 ug/kg/h via INTRAVENOUS
  Administered 2019-12-29: 0.5 ug/kg/h via INTRAVENOUS
  Filled 2019-12-27 (×4): qty 100

## 2019-12-27 NOTE — Progress Notes (Addendum)
NAME:  Bradley Chambers, MRN:  [id number], DOB:  05/10/1956, LOS: 46 ADMISSION DATE:  12/05/2019, CONSULTATION DATE:  5/20 REFERRING MD:  Lanney Gins, CHIEF COMPLAINT:  Dyspnea   Brief History   64 y/o male admitted for severe acute respiratory failure with hypoxemia on 5/18 to [facility], required intubation on 5/20 and tranfer to [facility] COVID+  Past Medical History  Hypertension Hyperlipidemia Obesity  Significant Hospital Events   5/18 came to [facility] ED, admitted to ICU off/on BIPAP, treated with remdesivir, decadron, ivermectin 5/20 intubated for severe acute respiratory failure with hypoxemia and tx to [facility], s/p Bronch 5/22 Started paralytics 5/23 Off pressors  Consults:  PCCM  Procedures:  5/20 ETT >  5/20 R IJ CVL >   Significant Diagnostic Tests:  5/18 CT angiogram chest > bilateral upper lobe predominant ground glass opacification and some patchy lower lobe consolidation, reactive lymphadenopathy, no PE  5/19 TTE> LVEF 55-60%, LV normal size/function, RV systolic function normal, RV size enlarged, moderately elevated PA pressure, left atrium mildly dilated, no mitral regurgitation  5/20 > Bronchoscopy  Micro Data:  5/19 SARS CoV2 Ag > negative 5/19 SARS CoV2 PCR > negative 5/19 SARS CoV2 Ab > negative 5/18 Blood > negative 5/18 blood > negative 5/18 CMV IgM > negative 5/18 EBV IgG > elevated 5/18 EBV IgM > negative 5/18 HIV > negative 5/20 BAL bacterial culture > neg 5/20 BAL fungal culture > negative 5/20 BAL aspergillus antigen > 0.05 5/20 BAL PJP > neg 5/20 BAL respiratory viral panel >> negative 5/20 BAL SARS CoV 2 > Postive 5/20 Nasal SARS CoV 2 > positive 5/20 serum fungitel> negative  Antimicrobials/COVID Rx  5/17 zosyn x1 , 5/20  5/17 vanc x1, 5/20 > 5/19 Ivermectin > 5/20 Azithromycin 5/21 >> 5/23 Ceftriaxone 5/21>> Tocilizumab 5/21  Remdesivir 5/20 >> 5/24 Decadron 5/18 >>  Interim  history/subjective:   Unable to wean sedation due to agitation. Continues on lasix but I/O positive  Objective   Blood pressure (!) 114/59, pulse 64, temperature 98.4 F (36.9 C), temperature source Oral, resp. rate (!) 31, height _0  (1.651 m), weight 91.3 kg, SpO2 99 %.    Vent Mode: PRVC FiO2 (%):  [50 %-60 %] 50 % Set Rate:  [32 bmp] 32 bmp Vt Set:  [370 mL] 370 mL PEEP:  [14 cmH20] 14 cmH20 Plateau Pressure:  [22 cmH20-35 cmH20] 27 cmH20   Intake/Output Summary (Last 24 hours) at 12/27/2019 0900 Last data filed at 12/27/2019 2637 Gross per 24 hour  Intake 4190.17 ml  Output 1850 ml  Net 2340.17 ml   Filed Weights   12/25/19 0500 12/26/19 0400 12/27/19 0200  Weight: 91.1 kg 92.3 kg 91.3 kg    Examination: Gen:      No acute distress HEENT:  EOMI, sclera anicteric Neck:     No masses; no thyromegaly, ETT Lungs:    Clear to auscultation bilaterally; normal respiratory effort CV:         Regular rate and rhythm; no murmurs Abd:      + bowel sounds; soft, non-tender; no palpable masses, no distension Ext:    No edema; adequate peripheral perfusion Skin:      Warm and dry; no rash Neuro: Sedated  Labs are stable Chest x-ray 5/30 with stable bilateral airspace disease.  Resolved Hospital Problem list   AKI, hyperkalemia, shock, hypernatremia  Assessment & Plan:  Acute hypoxemic respiratory failure due to COVID ARDS Continue low tidal volume ventilation, cont wean for sats >90%  Wean PEEP/FiO2 Completed ceftriaxone, azithromycin Continue Lasix 40 mg bid Reduce free water flushes  DM2 with Hyperglycemia: Continue sliding-scale insulin resistant scale Continue Levemir 20 units twice daily Tube feed coverage  Hx HTN-  Will need to restart home meds- norvasc Labetelol PRN  Need for sedation for mechanical ventilation Continue fentanyl, midazolam, wean as able Will add precedex to get him off benzo drip  Thrombocytopenia:  -2/2 acute illness -relatively  stable  Best practice:  Diet: TF Pain/Anxiety/Delirium protocol (if indicated): Weaning sedation. Rass goal -2 VAP protocol (if indicated): yes DVT prophylaxis: sub q hep GI prophylaxis: PPI Glucose control: SSI, levemir Mobility: bed rest Code Status: full Family Communication: Wife updated 5/28 via phone. Daughter updated 5/30 Disposition: remain in ICU  Critical care time:   The patient is critically ill with multiple organ system failure and requires high complexity decision making for assessment and support, frequent evaluation and titration of therapies, advanced monitoring, review of radiographic studies and interpretation of complex data.   Critical Care Time devoted to patient care services, exclusive of separately billable procedures, described in this note is 35 minutes.   Marshell Garfinkel MD [facility] Please see [email or web address] for pager details.  12/27/2019, 9:00 AM

## 2019-12-27 NOTE — Progress Notes (Signed)
Wasted 250cc of fentanyl due to hole in the bag : witnessed by Lennox Grumbles

## 2019-12-28 DIAGNOSIS — J9622 Acute and chronic respiratory failure with hypercapnia: Secondary | ICD-10-CM

## 2019-12-28 DIAGNOSIS — J9621 Acute and chronic respiratory failure with hypoxia: Secondary | ICD-10-CM

## 2019-12-28 LAB — POCT I-STAT 7, (LYTES, BLD GAS, ICA,H+H)
Acid-Base Excess: 16 mmol/L — ABNORMAL HIGH (ref 0.0–2.0)
Bicarbonate: 44.3 mmol/L — ABNORMAL HIGH (ref 20.0–28.0)
Calcium, Ion: 1.2 mmol/L (ref 1.15–1.40)
HCT: 36 % — ABNORMAL LOW (ref 39.0–52.0)
Hemoglobin: 12.2 g/dL — ABNORMAL LOW (ref 13.0–17.0)
O2 Saturation: 89 %
Patient temperature: 98.9
Potassium: 4.4 mmol/L (ref 3.5–5.1)
Sodium: 136 mmol/L (ref 135–145)
TCO2: 47 mmol/L — ABNORMAL HIGH (ref 22–32)
pCO2 arterial: 77.5 mmHg (ref 32.0–48.0)
pH, Arterial: 7.366 (ref 7.350–7.450)
pO2, Arterial: 62 mmHg — ABNORMAL LOW (ref 83.0–108.0)

## 2019-12-28 LAB — GLUCOSE, CAPILLARY
Glucose-Capillary: 166 mg/dL — ABNORMAL HIGH (ref 70–99)
Glucose-Capillary: 146 mg/dL — ABNORMAL HIGH (ref 70–99)
Glucose-Capillary: 157 mg/dL — ABNORMAL HIGH (ref 70–99)
Glucose-Capillary: 164 mg/dL — ABNORMAL HIGH (ref 70–99)
Glucose-Capillary: 143 mg/dL — ABNORMAL HIGH (ref 70–99)
Glucose-Capillary: 180 mg/dL — ABNORMAL HIGH (ref 70–99)

## 2019-12-28 MED ORDER — PHENYLEPHRINE HCL-NACL 10-0.9 MG/250ML-% IV SOLN
0.0000 ug/min | INTRAVENOUS | Status: DC
  Administered 2019-12-28: 20 ug/min via INTRAVENOUS
  Administered 2019-12-29: 10 ug/min via INTRAVENOUS
  Filled 2019-12-28: qty 250

## 2019-12-28 MED ORDER — FUROSEMIDE 10 MG/ML IJ SOLN
40.0000 mg | Freq: Four times a day (QID) | INTRAMUSCULAR | Status: DC
  Administered 2019-12-28 – 2019-12-29 (×3): 40 mg via INTRAVENOUS
  Filled 2019-12-28 (×3): qty 4

## 2019-12-28 MED ORDER — HYDRALAZINE HCL 10 MG PO TABS: 10.0000 mg | ORAL_TABLET | Freq: Three times a day (TID) | ORAL | Status: DC

## 2019-12-28 MED ORDER — CLONAZEPAM 1 MG PO TABS
2.0000 mg | ORAL_TABLET | Freq: Two times a day (BID) | ORAL | Status: DC
  Administered 2019-12-28 – 2020-01-02 (×11): 2 mg
  Filled 2019-12-28 (×11): qty 2

## 2019-12-28 NOTE — Progress Notes (Signed)
1930- Patient asynchronous with vent, respiratory rate up to 50's, with labored, agonal like respirations. Vent continuously alarming, RT at bedside to assess vent, RT feels patient needs too be more sedated. Increased Fentanyl DTT and Versed DTT. Elink RN camera in the room and updated on patient, she will pass along to MD.

## 2019-12-28 NOTE — Progress Notes (Addendum)
NAME:  Bradley Chambers, MRN:  [id number], DOB:  March 07, 1956, LOS: 45 ADMISSION DATE:  12/18/2019, CONSULTATION DATE:  5/20 REFERRING MD:  Lanney Gins, CHIEF COMPLAINT:  Dyspnea   Brief History   64 y/o male admitted for severe acute respiratory failure with hypoxemia on 5/18 to [facility], required intubation on 5/20 and tranfer to [facility] COVID+  Past Medical History  Hypertension Hyperlipidemia Obesity  Significant Hospital Events   5/18 came to [facility] ED, admitted to ICU off/on BIPAP, treated with remdesivir, decadron, ivermectin 5/20 intubated for severe acute respiratory failure with hypoxemia and tx to [facility], s/p Bronch 5/22 Started paralytics 5/23 Off pressors 5/31 Dysynchronous on vent. Agitated when sedation is weaned.   Consults:  PCCM  Procedures:  5/20 ETT >  5/20 R IJ CVL >   Significant Diagnostic Tests:  5/18 CT angiogram chest > bilateral upper lobe predominant ground glass opacification and some patchy lower lobe consolidation, reactive lymphadenopathy, no PE  5/19 TTE> LVEF 55-60%, LV normal size/function, RV systolic function normal, RV size enlarged, moderately elevated PA pressure, left atrium mildly dilated, no mitral regurgitation  5/20 > Bronchoscopy  Micro Data:  5/19 SARS CoV2 Ag > negative 5/19 SARS CoV2 PCR > negative 5/19 SARS CoV2 Ab > negative 5/18 Blood > negative 5/18 blood > negative 5/18 CMV IgM > negative 5/18 EBV IgG > elevated 5/18 EBV IgM > negative 5/18 HIV > negative 5/20 BAL bacterial culture > neg 5/20 BAL fungal culture > negative 5/20 BAL aspergillus antigen > 0.05 5/20 BAL PJP > neg 5/20 BAL respiratory viral panel >> negative 5/20 BAL SARS CoV 2 > Postive 5/20 Nasal SARS CoV 2 > positive 5/20 serum fungitel> negative  Antimicrobials/COVID Rx  5/17 zosyn x1 , 5/20  5/17 vanc x1, 5/20 > 5/19 Ivermectin > 5/20 Azithromycin 5/21 >> 5/23 Ceftriaxone 5/21>> Tocilizumab  5/21  Remdesivir 5/20 >> 5/24 Decadron 5/18 >> 5/29  Interim history/subjective:   Unable to wean sedation due to agitation. Continues on lasix but I/O positive  Objective   Blood pressure (!) 161/78, pulse 66, temperature 97.7 F (36.5 C), temperature source Oral, resp. rate (!) 25, height _0  (1.651 m), weight 94.2 kg, SpO2 93 %.    Vent Mode: PRVC FiO2 (%):  [50 %-60 %] 60 % Set Rate:  [32 bmp-35 bmp] 35 bmp Vt Set:  [370 mL] 370 mL PEEP:  [12 cmH20-14 cmH20] 14 cmH20 Plateau Pressure:  [18 cmH20-27 cmH20] 19 cmH20   Intake/Output Summary (Last 24 hours) at 12/28/2019 0746 Last data filed at 12/28/2019 0700 Gross per 24 hour  Intake 3618.99 ml  Output 2850 ml  Net 768.99 ml   Filed Weights   12/26/19 0400 12/27/19 0200 12/28/19 0500  Weight: 92.3 kg 91.3 kg 94.2 kg    Examination: Gen:      No acute distress HEENT:  EOMI, sclera anicteric Neck:     No masses; no thyromegaly, ETT Lungs:    Clear to auscultation bilaterally; normal respiratory effort CV:         Regular rate and rhythm; no murmurs Abd:      + bowel sounds; soft, non-tender; no palpable masses, no distension Ext:    No edema; adequate peripheral perfusion Skin:      Warm and dry; no rash Neuro: Sedated, unresponsive  Labs are stable No new imaging  Resolved Hospital Problem list   AKI, hyperkalemia, shock, hypernatremia  Assessment & Plan:  Acute hypoxemic respiratory failure due to COVID ARDS Continue  low tidal volume ventilation, cont wean for sats >90% Wean PEEP/FiO2 Completed ceftriaxone, azithromycin Continue Lasix 40 mg bid Reduce free water flushes as Na is better  DM2 with Hyperglycemia: Continue sliding-scale insulin resistant scale Continue Levemir 20 units twice daily Tube feed coverage  Hx HTN-  Continue norvasc Labetelol PRN  Need for sedation for mechanical ventilation Fentanyl, midazolam, wean as able Low dose precedex Increase klonopin dose Continue oxy per  tube  Thrombocytopenia:  -2/2 acute illness -relatively stable  Best practice:  Diet: TF Pain/Anxiety/Delirium protocol (if indicated): Weaning sedation. Rass goal -1 VAP protocol (if indicated): yes DVT prophylaxis: sub q hep GI prophylaxis: PPI Glucose control: SSI, levemir Mobility: bed rest Code Status: full Family Communication: No answer from wife or daughter on 5/31 Disposition: ICU  Critical care time:   The patient is critically ill with multiple organ system failure and requires high complexity decision making for assessment and support, frequent evaluation and titration of therapies, advanced monitoring, review of radiographic studies and interpretation of complex data.   Critical Care Time devoted to patient care services, exclusive of separately billable procedures, described in this note is 35 minutes.   Marshell Garfinkel MD [facility] Please see [email or web address] for pager details.  12/28/2019, 7:46 AM

## 2019-12-28 NOTE — Progress Notes (Signed)
eLink Physician-Brief Progress Note Patient Name: Bradley Chambers DOB: 24-Feb-1956 MRN: [id number]   Date of Service  12/28/2019  HPI/Events of Note  Hypotension - BP + 65/41 with MAP = 58.   eICU Interventions  Plan: 1. Phenylephrine IV infusion. Titrate to MAP >= 65.  2. Monitor CVP now and Q 4 hours.      Intervention Category Major Interventions: Hypotension - evaluation and management  Lenell Antu 12/28/2019, 9:44 PM

## 2019-12-29 ENCOUNTER — Inpatient Hospital Stay (HOSPITAL_COMMUNITY): Payer: 59

## 2019-12-29 LAB — BASIC METABOLIC PANEL
Anion gap: 11 (ref 5–15)
BUN: 92 mg/dL — ABNORMAL HIGH (ref 8–23)
CO2: 35 mmol/L — ABNORMAL HIGH (ref 22–32)
Calcium: 7.9 mg/dL — ABNORMAL LOW (ref 8.9–10.3)
Chloride: 93 mmol/L — ABNORMAL LOW (ref 98–111)
Creatinine, Ser: 1.39 mg/dL — ABNORMAL HIGH (ref 0.61–1.24)
GFR calc Af Amer: >60 mL/min (ref >60)
GFR calc non Af Amer: 53 mL/min — ABNORMAL LOW (ref >60)
Glucose, Bld: 203 mg/dL — ABNORMAL HIGH (ref 70–99)
Potassium: 4.6 mmol/L (ref 3.5–5.1)
Sodium: 139 mmol/L (ref 135–145)

## 2019-12-29 LAB — CBC
HCT: 35.5 % — ABNORMAL LOW (ref 39.0–52.0)
Hemoglobin: 11.3 g/dL — ABNORMAL LOW (ref 13.0–17.0)
MCH: 31.8 pg (ref 26.0–34.0)
MCHC: 31.8 g/dL (ref 30.0–36.0)
MCV: 100 fL (ref 80.0–100.0)
Platelets: 77 10*3/uL — ABNORMAL LOW (ref 150–400)
RBC: 3.55 MIL/uL — ABNORMAL LOW (ref 4.22–5.81)
RDW: 15.3 % (ref 11.5–15.5)
WBC: 9.6 10*3/uL (ref 4.0–10.5)
nRBC: 0.3 % — ABNORMAL HIGH (ref 0.0–0.2)

## 2019-12-29 LAB — GLUCOSE, CAPILLARY
Glucose-Capillary: 168 mg/dL — ABNORMAL HIGH (ref 70–99)
Glucose-Capillary: 138 mg/dL — ABNORMAL HIGH (ref 70–99)
Glucose-Capillary: 199 mg/dL — ABNORMAL HIGH (ref 70–99)
Glucose-Capillary: 149 mg/dL — ABNORMAL HIGH (ref 70–99)
Glucose-Capillary: 187 mg/dL — ABNORMAL HIGH (ref 70–99)
Glucose-Capillary: 200 mg/dL — ABNORMAL HIGH (ref 70–99)
Glucose-Capillary: 201 mg/dL — ABNORMAL HIGH (ref 70–99)

## 2019-12-29 LAB — PHOSPHORUS: Phosphorus: 8.2 mg/dL — ABNORMAL HIGH (ref 2.5–4.6)

## 2019-12-29 LAB — MAGNESIUM: Magnesium: 2.9 mg/dL — ABNORMAL HIGH (ref 1.7–2.4)

## 2019-12-29 MED ORDER — HEPARIN SODIUM (PORCINE) 5000 UNIT/ML IJ SOLN
5000.0000 [IU] | Freq: Three times a day (TID) | INTRAMUSCULAR | Status: DC
  Administered 2019-12-29 – 2020-01-02 (×12): 5000 [IU] via SUBCUTANEOUS
  Filled 2019-12-29 (×12): qty 1

## 2019-12-29 MED ORDER — INSULIN DETEMIR 100 UNIT/ML ~~LOC~~ SOLN
25.0000 [IU] | Freq: Two times a day (BID) | SUBCUTANEOUS | Status: DC
  Administered 2019-12-29 – 2020-01-02 (×9): 25 [IU] via SUBCUTANEOUS
  Filled 2019-12-29 (×12): qty 0.25

## 2019-12-29 MED ORDER — SODIUM CHLORIDE 0.9 % IV SOLN
1.0000 mg/h | INTRAVENOUS | Status: DC
  Administered 2019-12-29: 4 mg/h via INTRAVENOUS
  Administered 2019-12-29: 3 mg/h via INTRAVENOUS
  Administered 2019-12-30 – 2020-01-02 (×6): 4 mg/h via INTRAVENOUS
  Filled 2019-12-29 (×12): qty 5

## 2019-12-29 MED ORDER — HYDROMORPHONE BOLUS VIA INFUSION
0.5000 mg | INTRAVENOUS | Status: DC | PRN
  Administered 2020-01-01 (×5): 1 mg via INTRAVENOUS
  Filled 2019-12-29: qty 1

## 2019-12-29 NOTE — Progress Notes (Signed)
eLink Physician-Brief Progress Note Patient Name: TIRAN SAUSEDA DOB: 1955-12-30 MRN: [id number]   Date of Service  12/29/2019  HPI/Events of Note  Oliguria - Bladder scan with 900 mL residual.   eICU Interventions  Plan: 1. I/O cath PRN.      Intervention Category Major Interventions: Other:  Lenell Antu 12/29/2019, 8:46 PM

## 2019-12-29 NOTE — Progress Notes (Signed)
Patient had only voided 100 ML from 1200-2000. Bladder feels distended to palpation. Bladder scanned volume >900. Call to [facility] made aware verbal order to straight cath patient, per RN MD will put order in. Straight cathed patient for 1000 ML yellow urine , sterile technique used, tolerated well, Ander Purpura RN observed.

## 2019-12-29 NOTE — Progress Notes (Signed)
NAME:  Bradley Chambers, MRN:  [id number], DOB:  06/04/1956, LOS: 12 ADMISSION DATE:  12/22/2019, CONSULTATION DATE:  5/20 REFERRING MD:  Lanney Gins, CHIEF COMPLAINT:  dyspnea   Brief History   64 y/o male admitted for severe acute respiratory failure with hypoxemia on 5/18 to [facility], required intubation on 5/20 and tranfer to [facility] COVID+   Past Medical History  Hypertension Hyperlipidemia Obesity  Significant Hospital Events   5/18 came to [facility] ED, admitted to ICU off/on BIPAP, treated with remdesivir, decadron, ivermectin 5/20 intubated for severe acute respiratory failure with hypoxemia and tx to [facility], s/p Bronch 5/22 Started paralytics 5/23 Off pressors 5/31 Dysynchronous on vent. Agitated when sedation is weaned  Consults:  PCCM  Procedures:  5/20 ETT >  5/20 R IJ CVL >   Significant Diagnostic Tests:  5/18 CT angiogram chest > bilateral upper lobe predominant ground glass opacification and some patchy lower lobe consolidation, reactive lymphadenopathy, no PE  5/19 TTE> LVEF 55-60%, LV normal size/function, RV systolic function normal, RV size enlarged, moderately elevated PA pressure, left atrium mildly dilated, no mitral regurgitation  5/20 > Bronchoscopy  Micro Data:  5/19 SARS CoV2 Ag > negative 5/19 SARS CoV2 PCR > negative 5/19 SARS CoV2 Ab > negative 5/18 Blood > negative 5/18 blood > negative 5/18 CMV IgM > negative 5/18 EBV IgG > elevated 5/18 EBV IgM > negative 5/18 HIV > negative 5/20 BAL bacterial culture > neg 5/20 BAL fungal culture > negative 5/20 BAL aspergillus antigen > 0.05 5/20 BAL PJP > neg 5/20 BAL respiratory viral panel >> negative 5/20 BAL SARS CoV 2 > Postive 5/20 Nasal SARS CoV 2 > positive 5/20 serum fungitel> negative  Antimicrobials:  5/17 zosyn x1 , 5/20  5/17 vanc x1, 5/20 > 5/19 Ivermectin > 5/20 Azithromycin 5/21 >> 5/23 Ceftriaxone 5/21>> Tocilizumab 5/21  Remdesivir 5/20 >> 5/24 Decadron 5/18 >> 5/29  Interim  history/subjective:   Some ventilatory dyssynchrony  Objective   Blood pressure (!) 98/47, pulse (!) 56, temperature 98.4 F (36.9 C), temperature source Oral, resp. rate (!) 30, height _0  (1.651 m), weight 92.9 kg, SpO2 94 %. CVP:  [10 mmHg-11 mmHg] 11 mmHg  Vent Mode: PRVC FiO2 (%):  [60 %-90 %] 80 % Set Rate:  [35 bmp] 35 bmp Vt Set:  [370 mL-3701 mL] 370 mL PEEP:  [14 cmH20] 14 cmH20 Plateau Pressure:  [23 cmH20-30 cmH20] 29 cmH20   Intake/Output Summary (Last 24 hours) at 12/29/2019 0742 Last data filed at 12/29/2019 0700 Gross per 24 hour  Intake 3445.95 ml  Output 1350 ml  Net 2095.95 ml   Filed Weights   12/27/19 0200 12/28/19 0500 12/29/19 0111  Weight: 91.3 kg 94.2 kg 92.9 kg    Examination:  General:  In bed on vent HENT: NCAT ETT in place PULM: Wheezes, crackles bases B, vent supported breathing CV: RRR, no mgr GI: BS+, soft, nontender MSK: normal bulk and tone Neuro: sedated on vent   Resolved Hospital Problem list   AKI, hyperkalemia, shock, hypernatremia  Assessment & Plan:  Acute hypoxemic respiratory failure due to COVID ARDS Continue mechanical ventilation per ARDS protocol Target TVol 6-8cc/kgIBW Target Plateau Pressure < 30cm H20 Target driving pressure less than 15 cm of water Target PaO2 55-65: titrate PEEP/FiO2 per protocol As long as PaO2 to FiO2 ratio is less than 1:150 position in prone position for 16 hours a day Check CVP daily if CVL in place Target CVP less than 4, diurese as  necessary Ventilator associated pneumonia prevention protocol 6/1> Plateau 27, very dyssynchronous, change sedation around then re-assess, hold lasix with increased Cr  DM2 with Hyperglycemia: Continue SSI, tube feeding coverage and levemir  Hx HTN:  norvasc to continue  AKI: likely due to diuretics Monitor BMET and UOP Replace electrolytes as needed   Need for sedation for mechanical ventilation Severe vent dyssynchrony 6/1 Narcotic  tachyphylaxis RASS target -3 to -4 Continue oxycodone, clonazepam Change fentanyl to dilaudid Continue midazolam Stop precedex  Thrombocytopenia:  Monitor for bleeding   Best practice:  Diet: tube feeding Pain/Anxiety/Delirium protocol (if indicated): as above VAP protocol (if indicated): yes DVT prophylaxis: change sub q hep to 5000 q8h GI prophylaxis: Pantoprazole for stress ulcer prophylaxis Glucose control: as above Mobility: bed rest Code Status: full Family Communication: I called his wife Thayer Headings on 6/1 for an update Disposition: remain in ICU   Critical care time: 35 minutes     Roselie Awkward, MD [place] [facility] Pager: [phone] Cell: [phone] If no response, call [phone]

## 2019-12-29 DEATH — deceased

## 2019-12-30 ENCOUNTER — Inpatient Hospital Stay (HOSPITAL_COMMUNITY): Payer: 59

## 2019-12-30 LAB — CBC WITH DIFFERENTIAL/PLATELET
Abs Immature Granulocytes: 0.09 10*3/uL — ABNORMAL HIGH (ref 0.00–0.07)
Basophils Absolute: 0 10*3/uL (ref 0.0–0.1)
Basophils Relative: 0 %
Eosinophils Absolute: 0.1 10*3/uL (ref 0.0–0.5)
Eosinophils Relative: 2 %
HCT: 33.9 % — ABNORMAL LOW (ref 39.0–52.0)
Hemoglobin: 10.3 g/dL — ABNORMAL LOW (ref 13.0–17.0)
Immature Granulocytes: 2 %
Lymphocytes Relative: 4 %
Lymphs Abs: 0.2 10*3/uL — ABNORMAL LOW (ref 0.7–4.0)
MCH: 31.4 pg (ref 26.0–34.0)
MCHC: 30.4 g/dL (ref 30.0–36.0)
MCV: 103.4 fL — ABNORMAL HIGH (ref 80.0–100.0)
Monocytes Absolute: 0.3 10*3/uL (ref 0.1–1.0)
Monocytes Relative: 5 %
Neutro Abs: 5.2 10*3/uL (ref 1.7–7.7)
Neutrophils Relative %: 87 %
Platelets: 60 10*3/uL — ABNORMAL LOW (ref 150–400)
RBC: 3.28 MIL/uL — ABNORMAL LOW (ref 4.22–5.81)
RDW: 15.6 % — ABNORMAL HIGH (ref 11.5–15.5)
WBC: 6 10*3/uL (ref 4.0–10.5)
nRBC: 0 % (ref 0.0–0.2)

## 2019-12-30 LAB — COMPREHENSIVE METABOLIC PANEL
ALT: 63 U/L — ABNORMAL HIGH (ref 0–44)
AST: 25 U/L (ref 15–41)
Albumin: 2.4 g/dL — ABNORMAL LOW (ref 3.5–5.0)
Alkaline Phosphatase: 53 U/L (ref 38–126)
Anion gap: 7 (ref 5–15)
BUN: 105 mg/dL — ABNORMAL HIGH (ref 8–23)
CO2: 38 mmol/L — ABNORMAL HIGH (ref 22–32)
Calcium: 8.1 mg/dL — ABNORMAL LOW (ref 8.9–10.3)
Chloride: 97 mmol/L — ABNORMAL LOW (ref 98–111)
Creatinine, Ser: 1.4 mg/dL — ABNORMAL HIGH (ref 0.61–1.24)
GFR calc Af Amer: >60 mL/min (ref >60)
GFR calc non Af Amer: 53 mL/min — ABNORMAL LOW (ref >60)
Glucose, Bld: 203 mg/dL — ABNORMAL HIGH (ref 70–99)
Potassium: 4.9 mmol/L (ref 3.5–5.1)
Sodium: 142 mmol/L (ref 135–145)
Total Bilirubin: 0.9 mg/dL (ref 0.3–1.2)
Total Protein: 5 g/dL — ABNORMAL LOW (ref 6.5–8.1)

## 2019-12-30 LAB — POCT I-STAT 7, (LYTES, BLD GAS, ICA,H+H)
Acid-Base Excess: 15 mmol/L — ABNORMAL HIGH (ref 0.0–2.0)
Bicarbonate: 44.2 mmol/L — ABNORMAL HIGH (ref 20.0–28.0)
Calcium, Ion: 1.25 mmol/L (ref 1.15–1.40)
HCT: 30 % — ABNORMAL LOW (ref 39.0–52.0)
Hemoglobin: 10.2 g/dL — ABNORMAL LOW (ref 13.0–17.0)
O2 Saturation: 83 %
Patient temperature: 98.6
Potassium: 4.9 mmol/L (ref 3.5–5.1)
Sodium: 144 mmol/L (ref 135–145)
TCO2: 47 mmol/L — ABNORMAL HIGH (ref 22–32)
pCO2 arterial: 89.1 mmHg (ref 32.0–48.0)
pH, Arterial: 7.303 — ABNORMAL LOW (ref 7.350–7.450)
pO2, Arterial: 56 mmHg — ABNORMAL LOW (ref 83.0–108.0)

## 2019-12-30 LAB — GLUCOSE, CAPILLARY
Glucose-Capillary: 168 mg/dL — ABNORMAL HIGH (ref 70–99)
Glucose-Capillary: 163 mg/dL — ABNORMAL HIGH (ref 70–99)
Glucose-Capillary: 121 mg/dL — ABNORMAL HIGH (ref 70–99)
Glucose-Capillary: 180 mg/dL — ABNORMAL HIGH (ref 70–99)
Glucose-Capillary: 194 mg/dL — ABNORMAL HIGH (ref 70–99)
Glucose-Capillary: 171 mg/dL — ABNORMAL HIGH (ref 70–99)

## 2019-12-30 MED ORDER — TERAZOSIN HCL 1 MG PO CAPS
2.0000 mg | ORAL_CAPSULE | Freq: Two times a day (BID) | ORAL | Status: DC
  Administered 2019-12-30: 2 mg via ORAL
  Filled 2019-12-30: qty 2
  Filled 2019-12-30: qty 1
  Filled 2019-12-30: qty 2

## 2019-12-30 MED ORDER — TERAZOSIN HCL 2 MG PO CAPS
2.0000 mg | ORAL_CAPSULE | Freq: Two times a day (BID) | ORAL | Status: DC
  Administered 2019-12-30 – 2019-12-31 (×2): 2 mg
  Filled 2019-12-30 (×2): qty 2
  Filled 2019-12-30: qty 1
  Filled 2019-12-30 (×2): qty 2

## 2019-12-30 NOTE — Progress Notes (Signed)
RT and RN turned pt's head and rotated arms without any complications, ETT is secure.

## 2019-12-30 NOTE — Progress Notes (Signed)
Patient's head and arms repositioned without complications.  

## 2019-12-30 NOTE — Progress Notes (Signed)
NAME:  Bradley Chambers, MRN:  [id number], DOB:  12-Nov-1955, LOS: 43 ADMISSION DATE:  11/30/2019, CONSULTATION DATE:  5/20 REFERRING MD:  Lanney Gins, CHIEF COMPLAINT:  dyspnea   Brief History   64 y/o male admitted for severe acute respiratory failure with hypoxemia on 5/18 to [facility], required intubation on 5/20 and tranfer to [facility] COVID+   Past Medical History  Hypertension Hyperlipidemia Obesity  Significant Hospital Events   5/18 came to [facility] ED, admitted to ICU off/on BIPAP, treated with remdesivir, decadron, ivermectin 5/20 intubated for severe acute respiratory failure with hypoxemia and tx to [facility], s/p Bronch 5/22 Started paralytics 5/23 Off pressors 5/31 Dysynchronous on vent. Agitated when sedation is weaned  Consults:  PCCM  Procedures:  5/20 ETT >  5/20 R IJ CVL >   Significant Diagnostic Tests:  5/18 CT angiogram chest > bilateral upper lobe predominant ground glass opacification and some patchy lower lobe consolidation, reactive lymphadenopathy, no PE  5/19 TTE> LVEF 55-60%, LV normal size/function, RV systolic function normal, RV size enlarged, moderately elevated PA pressure, left atrium mildly dilated, no mitral regurgitation  5/20 > Bronchoscopy  Micro Data:  5/19 SARS CoV2 Ag > negative 5/19 SARS CoV2 PCR > negative 5/19 SARS CoV2 Ab > negative 5/18 Blood > negative 5/18 blood > negative 5/18 CMV IgM > negative 5/18 EBV IgG > elevated 5/18 EBV IgM > negative 5/18 HIV > negative 5/20 BAL bacterial culture > neg 5/20 BAL fungal culture > negative 5/20 BAL aspergillus antigen > 0.05 5/20 BAL PJP > neg 5/20 BAL respiratory viral panel >> negative 5/20 BAL SARS CoV 2 > Postive 5/20 Nasal SARS CoV 2 > positive 5/20 serum fungitel> negative  Antimicrobials:  5/17 zosyn x1 , 5/20  5/17 vanc x1, 5/20 > 5/19 Ivermectin > 5/20 Azithromycin 5/21 >> 5/23 Ceftriaxone 5/21>> Tocilizumab 5/21  Remdesivir 5/20 >> 5/24 Decadron 5/18 >> 5/29  Interim  history/subjective:   Changed sedation yesterday: synchrony improved  Objective   Blood pressure (!) 114/46, pulse 67, temperature 98.6 F (37 C), temperature source Oral, resp. rate (!) 35, height _0  (1.651 m), weight 92.9 kg, SpO2 95 %. CVP:  [10 mmHg-13 mmHg] 12 mmHg  Vent Mode: PRVC FiO2 (%):  [80 %] 80 % Set Rate:  [35 bmp] 35 bmp Vt Set:  [370 mL] 370 mL PEEP:  [14 cmH20] 14 cmH20 Plateau Pressure:  [25 cmH20-30 cmH20] 30 cmH20   Intake/Output Summary (Last 24 hours) at 12/30/2019 0846 Last data filed at 12/30/2019 0800 Gross per 24 hour  Intake 2195.74 ml  Output 4810 ml  Net -2614.26 ml   Filed Weights   12/28/19 0500 12/29/19 0111 12/30/19 0417  Weight: 94.2 kg 92.9 kg 92.9 kg    Examination:  General:  In bed on vent HENT: NCAT ETT in place PULM: wheezing, crackles bases B, vent supported breathing CV: RRR, no mgr GI: BS+, soft, nontender MSK: normal bulk and tone Neuro: sedated on vent    Resolved Hospital Problem list   AKI, hyperkalemia, shock, hypernatremia Severe vent dyssynchrony 6/1 Narcotic tachyphylaxis  Assessment & Plan:  Acute hypoxemic respiratory failure due to COVID ARDS 6/2 Plateau 27, driving pressure 13  Continue mechanical ventilation per ARDS protocol Target TVol 6-8cc/kgIBW Target Plateau Pressure < 30cm H20 Target driving pressure less than 15 cm of water Target PaO2 55-65: titrate PEEP/FiO2 per protocol As long as PaO2 to FiO2 ratio is less than 1:150 position in prone position for 16 hours a day Check  CVP daily if CVL in place Target CVP less than 4, diurese as necessary Ventilator associated pneumonia prevention protocol 6/2 prone, try to hold paralytic, hold lasix with renal function  DM2 with Hyperglycemia: Continue SSI, tube feeding coverage and levemir  Hx HTN:  norvasc to continue  AKI: likely due to diuretics Urinary retention Monitor BMET and UOP Replace electrolytes as needed Add alpha blocker   Need  for sedation for mechanical ventilation RASS target -3 to -4 Continue oxycodone, clonazepam Continue fentanyl Continue midazolam  Thrombocytopenia:  Monitor for bleeding   Best practice:  Diet: tube feeding Pain/Anxiety/Delirium protocol (if indicated): as above VAP protocol (if indicated): yes DVT prophylaxis: change sub q hep to 5000 q8h GI prophylaxis: Pantoprazole for stress ulcer prophylaxis Glucose control: as above Mobility: bed rest Code Status: full Family Communication: I tried calling [facility] Headings 6/2, left a message Disposition: remain in ICU   Critical care time: 35 minutes     Roselie Awkward, MD [place] P[facility]M Pager: [phone] Cell: [phone] If no response, call [phone]

## 2019-12-30 NOTE — Progress Notes (Signed)
Patient's ETT re-secured by cloth tape at 24 in the center with foam padding placed on the cheeks and upper lip.  Patient placed in prone position by MD x 1, RT x 1 and RN x 4 without complications.

## 2019-12-30 NOTE — Progress Notes (Signed)
eLink Physician-Brief Progress Note Patient Name: Bradley Chambers DOB: [birth year] MRN: [id number]   Date of Service  12/30/2019  HPI/Events of Note  Oliguria - Bladder scan of 1 liter X 2.  eICU Interventions  Will place Foley catheter.      Intervention Category Major Interventions: Other:  Yarexi Pawlicki Dennard Nip 12/30/2019, 3:06 AM

## 2019-12-30 NOTE — Progress Notes (Signed)
RT & RN turned patient's head and rotated his arms without complications.  ETT remains secure.

## 2019-12-30 NOTE — Progress Notes (Signed)
RN and RT turned the patient's head to the left and his arms rotated without complications.  ETT remains secure.

## 2019-12-30 NOTE — Progress Notes (Signed)
RT and RN turned pt's head and rotated arms without any complications, ETT is secure. RT will continue to monitor.

## 2019-12-31 ENCOUNTER — Inpatient Hospital Stay (HOSPITAL_COMMUNITY): Payer: 59

## 2019-12-31 LAB — COMPREHENSIVE METABOLIC PANEL
ALT: 60 U/L — ABNORMAL HIGH (ref 0–44)
AST: 32 U/L (ref 15–41)
Albumin: 2.4 g/dL — ABNORMAL LOW (ref 3.5–5.0)
Alkaline Phosphatase: 55 U/L (ref 38–126)
Anion gap: 3 — ABNORMAL LOW (ref 5–15)
BUN: 55 mg/dL — ABNORMAL HIGH (ref 8–23)
CO2: 41 mmol/L — ABNORMAL HIGH (ref 22–32)
Calcium: 8.4 mg/dL — ABNORMAL LOW (ref 8.9–10.3)
Chloride: 103 mmol/L (ref 98–111)
Creatinine, Ser: 0.77 mg/dL (ref 0.61–1.24)
GFR calc Af Amer: >60 mL/min (ref >60)
GFR calc non Af Amer: >60 mL/min (ref >60)
Glucose, Bld: 208 mg/dL — ABNORMAL HIGH (ref 70–99)
Potassium: 4.9 mmol/L (ref 3.5–5.1)
Sodium: 147 mmol/L — ABNORMAL HIGH (ref 135–145)
Total Bilirubin: 0.8 mg/dL (ref 0.3–1.2)
Total Protein: 5 g/dL — ABNORMAL LOW (ref 6.5–8.1)

## 2019-12-31 LAB — CBC WITH DIFFERENTIAL/PLATELET
Abs Immature Granulocytes: 0.07 10*3/uL (ref 0.00–0.07)
Basophils Absolute: 0 10*3/uL (ref 0.0–0.1)
Basophils Relative: 0 %
Eosinophils Absolute: 0.2 10*3/uL (ref 0.0–0.5)
Eosinophils Relative: 4 %
HCT: 34.7 % — ABNORMAL LOW (ref 39.0–52.0)
Hemoglobin: 10.6 g/dL — ABNORMAL LOW (ref 13.0–17.0)
Immature Granulocytes: 2 %
Lymphocytes Relative: 6 %
Lymphs Abs: 0.3 10*3/uL — ABNORMAL LOW (ref 0.7–4.0)
MCH: 32.5 pg (ref 26.0–34.0)
MCHC: 30.5 g/dL (ref 30.0–36.0)
MCV: 106.4 fL — ABNORMAL HIGH (ref 80.0–100.0)
Monocytes Absolute: 0.4 10*3/uL (ref 0.1–1.0)
Monocytes Relative: 8 %
Neutro Abs: 3.9 10*3/uL (ref 1.7–7.7)
Neutrophils Relative %: 80 %
Platelets: 56 10*3/uL — ABNORMAL LOW (ref 150–400)
RBC: 3.26 MIL/uL — ABNORMAL LOW (ref 4.22–5.81)
RDW: 15.9 % — ABNORMAL HIGH (ref 11.5–15.5)
WBC: 4.8 10*3/uL (ref 4.0–10.5)
nRBC: 0 % (ref 0.0–0.2)

## 2019-12-31 LAB — POCT I-STAT 7, (LYTES, BLD GAS, ICA,H+H)
Acid-Base Excess: 17 mmol/L — ABNORMAL HIGH (ref 0.0–2.0)
Acid-Base Excess: 18 mmol/L — ABNORMAL HIGH (ref 0.0–2.0)
Bicarbonate: 46.5 mmol/L — ABNORMAL HIGH (ref 20.0–28.0)
Bicarbonate: 47.3 mmol/L — ABNORMAL HIGH (ref 20.0–28.0)
Calcium, Ion: 1.32 mmol/L (ref 1.15–1.40)
Calcium, Ion: 1.36 mmol/L (ref 1.15–1.40)
HCT: 30 % — ABNORMAL LOW (ref 39.0–52.0)
HCT: 29 % — ABNORMAL LOW (ref 39.0–52.0)
Hemoglobin: 10.2 g/dL — ABNORMAL LOW (ref 13.0–17.0)
Hemoglobin: 9.9 g/dL — ABNORMAL LOW (ref 13.0–17.0)
O2 Saturation: 85 %
O2 Saturation: 82 %
Patient temperature: 98.4
Patient temperature: 97.8
Potassium: 5 mmol/L (ref 3.5–5.1)
Potassium: 5 mmol/L (ref 3.5–5.1)
Sodium: 149 mmol/L — ABNORMAL HIGH (ref 135–145)
Sodium: 149 mmol/L — ABNORMAL HIGH (ref 135–145)
TCO2: 49 mmol/L — ABNORMAL HIGH (ref 22–32)
TCO2: 50 mmol/L — ABNORMAL HIGH (ref 22–32)
pCO2 arterial: 95.6 mmHg (ref 32.0–48.0)
pCO2 arterial: 86.6 mmHg (ref 32.0–48.0)
pH, Arterial: 7.294 — ABNORMAL LOW (ref 7.350–7.450)
pH, Arterial: 7.343 — ABNORMAL LOW (ref 7.350–7.450)
pO2, Arterial: 59 mmHg — ABNORMAL LOW (ref 83.0–108.0)
pO2, Arterial: 52 mmHg — ABNORMAL LOW (ref 83.0–108.0)

## 2019-12-31 LAB — GLUCOSE, CAPILLARY
Glucose-Capillary: 153 mg/dL — ABNORMAL HIGH (ref 70–99)
Glucose-Capillary: 150 mg/dL — ABNORMAL HIGH (ref 70–99)
Glucose-Capillary: 128 mg/dL — ABNORMAL HIGH (ref 70–99)
Glucose-Capillary: 118 mg/dL — ABNORMAL HIGH (ref 70–99)
Glucose-Capillary: 169 mg/dL — ABNORMAL HIGH (ref 70–99)

## 2019-12-31 MED ORDER — STERILE WATER FOR INJECTION IJ SOLN
INTRAMUSCULAR | Status: AC
  Filled 2019-12-31: qty 10

## 2019-12-31 MED ORDER — ARTIFICIAL TEARS OPHTHALMIC OINT
1.0000 "application " | TOPICAL_OINTMENT | Freq: Three times a day (TID) | OPHTHALMIC | Status: DC
  Administered 2019-12-31 – 2020-01-01 (×4): 1 via OPHTHALMIC
  Filled 2019-12-31 (×3): qty 3.5

## 2019-12-31 MED ORDER — STERILE WATER FOR INJECTION IJ SOLN
INTRAMUSCULAR | Status: AC
  Administered 2019-12-31: 10 mL
  Filled 2019-12-31: qty 10

## 2019-12-31 MED ORDER — VECURONIUM BROMIDE 10 MG IV SOLR
0.1000 mg/kg | INTRAVENOUS | Status: DC | PRN
  Administered 2019-12-31 – 2020-01-01 (×6): 9.1 mg via INTRAVENOUS
  Filled 2019-12-31 (×6): qty 10

## 2019-12-31 MED ORDER — TERAZOSIN HCL 1 MG PO CAPS
2.0000 mg | ORAL_CAPSULE | Freq: Two times a day (BID) | ORAL | Status: DC
  Administered 2019-12-31 – 2020-01-02 (×4): 2 mg
  Filled 2019-12-31 (×5): qty 2

## 2019-12-31 MED ORDER — FREE WATER
300.0000 mL | Status: DC
  Administered 2019-12-31 – 2020-01-02 (×12): 300 mL

## 2019-12-31 NOTE — Progress Notes (Signed)
Patient placed back in supine position by RT x 1 and RN x 4 without complications. Patient's ETT re-secured by commerical tube holder; 24 at the lip.

## 2019-12-31 NOTE — Progress Notes (Signed)
Patient transported to 2M10 proned with no complications. All patient belongings with patient. (clothes)

## 2019-12-31 NOTE — Progress Notes (Signed)
eLink Physician-Brief Progress Note Patient Name: Bradley Chambers DOB: 1955/08/22 MRN: [id number]   Date of Service  12/31/2019  HPI/Events of Note  ABG on 90%/PRVC 35/TV 370/P 14 = 7.294/95.6/59. No room to increase PRVC rate.   eICU Interventions  Plan: 1. Continue present management.  2. Repeat ABG at 12 noon.      Intervention Category Major Interventions: Respiratory failure - evaluation and management;Acid-Base disturbance - evaluation and management  Kaneisha Ellenberger Eugene 12/31/2019, 3:53 AM

## 2019-12-31 NOTE — Progress Notes (Signed)
Patient's head and arms repositioned without complications.  

## 2019-12-31 NOTE — Progress Notes (Signed)
Patient transported proned and on vent from 3M11 to 2M10 without complications.

## 2019-12-31 NOTE — Progress Notes (Signed)
NAME:  Bradley Chambers, MRN:  [id number], DOB:  07-04-1956, LOS: 43 ADMISSION DATE:  12/03/2019, CONSULTATION DATE:  5/20 REFERRING MD:  Lanney Gins, CHIEF COMPLAINT:  dyspnea   Brief History   64 y/o male admitted for severe acute respiratory failure with hypoxemia on 5/18 to [facility], required intubation on 5/20 and tranfer to [facility] COVID+   Past Medical History  Hypertension Hyperlipidemia Obesity  Significant Hospital Events   5/18 came to [facility] ED, admitted to ICU off/on BIPAP, treated with remdesivir, decadron, ivermectin 5/20 intubated for severe acute respiratory failure with hypoxemia and tx to [facility], s/p Bronch 5/22 Started paralytics 5/23 Off pressors 5/31 Dysynchronous on vent. Agitated when sedation is weaned 6/2   Proning resumed  Consults:  PCCM  Procedures:  5/20 ETT >  5/20 R IJ CVL >   Significant Diagnostic Tests:  5/18 CT angiogram chest > bilateral upper lobe predominant ground glass opacification and some patchy lower lobe consolidation, reactive lymphadenopathy, no PE  5/19 TTE> LVEF 55-60%, LV normal size/function, RV systolic function normal, RV size enlarged, moderately elevated PA pressure, left atrium mildly dilated, no mitral regurgitation  5/20 > Bronchoscopy  Micro Data:  5/19 SARS CoV2 Ag > negative 5/19 SARS CoV2 PCR > negative 5/19 SARS CoV2 Ab > negative 5/18 Blood > negative 5/18 blood > negative 5/18 CMV IgM > negative 5/18 EBV IgG > elevated 5/18 EBV IgM > negative 5/18 HIV > negative 5/20 BAL bacterial culture > neg 5/20 BAL fungal culture > negative 5/20 BAL aspergillus antigen > 0.05 5/20 BAL PJP > neg 5/20 BAL respiratory viral panel >> negative 5/20 BAL SARS CoV 2 > Postive 5/20 Nasal SARS CoV 2 > positive 5/20 serum fungitel> negative  Antimicrobials:  5/17 zosyn x1 , 5/20  5/17 vanc x1, 5/20 > 5/19 Ivermectin > 5/20 Azithromycin 5/21 >> 5/23 Ceftriaxone 5/21>> Tocilizumab 5/21  Remdesivir 5/20 >> 5/24 Decadron 5/18  >> 5/29  Interim history/subjective:   Synchronous with the vent Required proning 6/2 No overnight events  Objective   Blood pressure (!) 134/50, pulse 79, temperature 98.4 F (36.9 C), temperature source Oral, resp. rate (!) 31, height _0  (1.651 m), weight 90.7 kg, SpO2 94 %. CVP:  [5 mmHg-12 mmHg] 7 mmHg  Vent Mode: PRVC FiO2 (%):  [90 %-100 %] 90 % Set Rate:  [35 bmp] 35 bmp Vt Set:  [370 mL] 370 mL PEEP:  [14 cmH20] 14 cmH20 Plateau Pressure:  [25 cmH20-33 cmH20] 25 cmH20   Intake/Output Summary (Last 24 hours) at 12/31/2019 3875 Last data filed at 12/31/2019 0800 Gross per 24 hour  Intake 2413.47 ml  Output 3960 ml  Net -1546.53 ml   Filed Weights   12/29/19 0111 12/30/19 0417 12/31/19 0404  Weight: 92.9 kg 92.9 kg 90.7 kg    Examination:  General: Appears comfortable HENT: Endotracheal tube in place PULM: Clear anteriorly CV: S1-S2 appreciated, no murmur GI: BS+, soft, nontender MSK: normal bulk and tone Neuro: sedated on vent  Labs significant for sodium 147, BUN 55, platelets 56  Resolved Hospital Problem list   AKI, hyperkalemia, shock, hypernatremia Severe vent dyssynchrony 6/1 Narcotic tachyphylaxis  Assessment & Plan:  Acute hypoxemic respiratory failure due to Covid ARDS Continue mechanical ventilation per ARDS protocol Target TVol 6-8cc/kgIBW Target Plateau Pressure < 30cm H20 Target driving pressure less than 15 cm of water Target PaO2 55-65: titrate PEEP/FiO2 per protocol As long as PaO2 to FiO2 ratio is less than 1:150 position in prone position for  16 hours a day Check CVP daily if CVL in place Target CVP less than 4, diurese as necessary Ventilator associated pneumonia prevention protocol Proned 6/2 We will prone today  Type 2 diabetes -Continue SSI, tube feeding with Levemir coverage  Hypertension -Continue Norvasc  AKI Urinary retention Alpha-blocker added Replete electrolytes Monitor output  DM2 with  Hyperglycemia: Continue SSI, tube feeding coverage and levemir  Hx HTN:  norvasc to continue  Need for sedation for mechanical ventilation RASS target -3 to -4 Continue Dilaudid, clonazepam Continue midazolam  Thrombocytopenia:  Monitor for bleeding  Hypernatremia -Increase free water 300 every 4   Best practice:  Diet: Continue tube feeds Pain/Anxiety/Delirium protocol (if indicated): Dilaudid, Versed, Klonopin VAP protocol (if indicated): yes DVT prophylaxis: Subcu heparin every 8 GI prophylaxis: Pantoprazole for stress ulcer prophylaxis Glucose control: as above Mobility: bed rest Code Status: full Family Communication: Will update Disposition: remain in ICU  The patient is critically ill with multiple organ systems failure and requires high complexity decision making for assessment and support, frequent evaluation and titration of therapies, application of advanced monitoring technologies and extensive interpretation of multiple databases. Critical Care Time devoted to patient care services described in this note independent of APP/resident time (if applicable)  is 35 minutes.   Sherrilyn Rist MD [place] [facility] Personal pager: [phone] If unanswered, please page CCM On-call: [phone]

## 2019-12-31 NOTE — Progress Notes (Signed)
Nutrition Follow-up  DOCUMENTATION CODES:   Obesity unspecified  INTERVENTION:  Tube Feeding:  Vital 1.5 at 60 ml/hr Pro-Stat 30 mL BID Provides 127 g of protein, 2360 kcals and 1094 mL of free water Meets 100% estimated calorie and protein needs  Total free water from TF plus current free water flushes (300 mL q 4 hours):  NUTRITION DIAGNOSIS:   Increased nutrient needs related to acute illness as evidenced by estimated needs.  Being addressed via TF  GOAL:   Patient will meet greater than or equal to 90% of their needs  Progressing  MONITOR:   Vent status, Labs, Weight trends, TF tolerance, I & O's  REASON FOR ASSESSMENT:   Ventilator, Consult Enteral/tube feeding initiation and management  ASSESSMENT:   64 year old male who was admitted on 12/15/19 for severe acute respiratory failure with hypoxemia secondary to COVID-19. Pt required intubation on 12/10/2019. PMH of HTN, HLD.  5/18 Admitted 5/20 Intubated, Bronch 5/21 TF initiated 6/02 Required prone position  Patient is currently intubated on ventilator support, continue prone position.  MV: 12.6 L/min Temp (24hrs), Avg:98 F (36.7 C), Min:96.5 F (35.8 C), Max:99.8 F (37.7 C)  Propofol: NONE  Vital 1.5 at 60 ml/hr via OG tube, Pro-Stat 30 mL BID Free water increased to 300 mL q 4 hours secondary to hypernatremia  Current weight 90.7 kg; admit weight 89.3 kg. Net + 3 L  Labs: sodium 149 (H) Meds: colace, ss novolog, levemir, novolog q 4 hours  Diet Order:   Diet Order            Diet NPO time specified  Diet effective now              EDUCATION NEEDS:   No education needs have been identified at this time  Skin:  Skin Assessment: Reviewed RN Assessment  Last BM:  5/28 rectal tube  Height:   Ht Readings from Last 1 Encounters:  12/31/19 5\' 5"  (1.651 m)    Weight:   Wt Readings from Last 1 Encounters:  12/31/19 90.7 kg    Ideal Body Weight:  61.8 kg  BMI:  Body mass index is  33.27 kg/m.  Estimated Nutritional Needs:   Kcal:  2000-2400 kcals  Protein:  110-130 grams  Fluid:  >/= 2.0 L   03/01/20 MS, RDN, LDN, CNSC Registered Dietitian III RD Pager Number and RD On-Call Pager Number Located in [place]

## 2019-12-31 NOTE — Progress Notes (Signed)
RT re-secured patient's ETT at 24 with cloth tape and foam padding on the cheeks and upper lip.  Patient placed in prone position by RT x 1 and RN x 3 without complications.  Patient's head was turned to the right and his arms rotated.  Patient tolerated well.

## 2020-01-01 LAB — POCT I-STAT 7, (LYTES, BLD GAS, ICA,H+H)
Acid-Base Excess: 19 mmol/L — ABNORMAL HIGH (ref 0.0–2.0)
Bicarbonate: 48.1 mmol/L — ABNORMAL HIGH (ref 20.0–28.0)
Calcium, Ion: 1.34 mmol/L (ref 1.15–1.40)
HCT: 29 % — ABNORMAL LOW (ref 39.0–52.0)
Hemoglobin: 9.9 g/dL — ABNORMAL LOW (ref 13.0–17.0)
O2 Saturation: 86 %
Patient temperature: 97.4
Potassium: 5 mmol/L (ref 3.5–5.1)
Sodium: 150 mmol/L — ABNORMAL HIGH (ref 135–145)
TCO2: >50 mmol/L — ABNORMAL HIGH (ref 22–32)
pCO2 arterial: 83.8 mmHg (ref 32.0–48.0)
pH, Arterial: 7.363 (ref 7.350–7.450)
pO2, Arterial: 55 mmHg — ABNORMAL LOW (ref 83.0–108.0)

## 2020-01-01 LAB — CBC
HCT: 34.9 % — ABNORMAL LOW (ref 39.0–52.0)
Hemoglobin: 10.1 g/dL — ABNORMAL LOW (ref 13.0–17.0)
MCH: 31.3 pg (ref 26.0–34.0)
MCHC: 28.9 g/dL — ABNORMAL LOW (ref 30.0–36.0)
MCV: 108 fL — ABNORMAL HIGH (ref 80.0–100.0)
Platelets: 58 10*3/uL — ABNORMAL LOW (ref 150–400)
RBC: 3.23 MIL/uL — ABNORMAL LOW (ref 4.22–5.81)
RDW: 15.9 % — ABNORMAL HIGH (ref 11.5–15.5)
WBC: 4 10*3/uL (ref 4.0–10.5)
nRBC: 0 % (ref 0.0–0.2)

## 2020-01-01 LAB — GLUCOSE, CAPILLARY
Glucose-Capillary: 102 mg/dL — ABNORMAL HIGH (ref 70–99)
Glucose-Capillary: 106 mg/dL — ABNORMAL HIGH (ref 70–99)
Glucose-Capillary: 120 mg/dL — ABNORMAL HIGH (ref 70–99)
Glucose-Capillary: 122 mg/dL — ABNORMAL HIGH (ref 70–99)
Glucose-Capillary: 124 mg/dL — ABNORMAL HIGH (ref 70–99)
Glucose-Capillary: 126 mg/dL — ABNORMAL HIGH (ref 70–99)
Glucose-Capillary: 141 mg/dL — ABNORMAL HIGH (ref 70–99)

## 2020-01-01 LAB — BASIC METABOLIC PANEL
Anion gap: 5 (ref 5–15)
BUN: 31 mg/dL — ABNORMAL HIGH (ref 8–23)
CO2: 43 mmol/L — ABNORMAL HIGH (ref 22–32)
Calcium: 8.9 mg/dL (ref 8.9–10.3)
Chloride: 104 mmol/L (ref 98–111)
Creatinine, Ser: 0.61 mg/dL (ref 0.61–1.24)
GFR calc Af Amer: >60 mL/min (ref >60)
GFR calc non Af Amer: >60 mL/min (ref >60)
Glucose, Bld: 114 mg/dL — ABNORMAL HIGH (ref 70–99)
Potassium: 5 mmol/L (ref 3.5–5.1)
Sodium: 152 mmol/L — ABNORMAL HIGH (ref 135–145)

## 2020-01-01 MED ORDER — VECURONIUM BROMIDE 10 MG IV SOLR
0.0000 ug/kg/min | INTRAVENOUS | Status: DC
  Administered 2020-01-01: 1 ug/kg/min via INTRAVENOUS
  Administered 2020-01-02: 1.2 ug/kg/min via INTRAVENOUS
  Filled 2020-01-01 (×3): qty 100

## 2020-01-01 MED ORDER — ARTIFICIAL TEARS OPHTHALMIC OINT
1.0000 "application " | TOPICAL_OINTMENT | Freq: Three times a day (TID) | OPHTHALMIC | Status: DC
  Administered 2020-01-01 – 2020-01-02 (×2): 1 via OPHTHALMIC

## 2020-01-01 MED ORDER — DEXTROSE 5 % IV BOLUS
500.0000 mL | Freq: Once | INTRAVENOUS | Status: AC
  Administered 2020-01-01: 500 mL via INTRAVENOUS

## 2020-01-01 NOTE — Progress Notes (Signed)
NAME:  Bradley Chambers, MRN:  [id number], DOB:  10/11/1955, LOS: 66 ADMISSION DATE:  12/08/2019, CONSULTATION DATE:  5/20 REFERRING MD:  Lanney Gins, CHIEF COMPLAINT:  dyspnea   Brief History   64 y/o male admitted for severe acute respiratory failure with hypoxemia on 5/18 to [facility], required intubation on 5/20 and tranfer to [facility] COVID+   Past Medical History  Hypertension Hyperlipidemia Obesity  Significant Hospital Events   5/18 came to [facility] ED, admitted to ICU off/on BIPAP, treated with remdesivir, decadron, ivermectin 5/20 intubated for severe acute respiratory failure with hypoxemia and tx to [facility], s/p Bronch 5/22 Started paralytics 5/23 Off pressors 5/31 Dysynchronous on vent. Agitated when sedation is weaned 6/2   Proning resumed 6/4 he was proned on 6/3, no significant benefit to his oxygenation  Consults:  PCCM  Procedures:  5/20 ETT >  5/20 R IJ CVL >   Significant Diagnostic Tests:  5/18 CT angiogram chest > bilateral upper lobe predominant ground glass opacification and some patchy lower lobe consolidation, reactive lymphadenopathy, no PE  5/19 TTE> LVEF 55-60%, LV normal size/function, RV systolic function normal, RV size enlarged, moderately elevated PA pressure, left atrium mildly dilated, no mitral regurgitation  5/20 > Bronchoscopy  Micro Data:  5/19 SARS CoV2 Ag > negative 5/19 SARS CoV2 PCR > negative 5/19 SARS CoV2 Ab > negative 5/18 Blood > negative 5/18 blood > negative 5/18 CMV IgM > negative 5/18 EBV IgG > elevated 5/18 EBV IgM > negative 5/18 HIV > negative 5/20 BAL bacterial culture > neg 5/20 BAL fungal culture > negative 5/20 BAL aspergillus antigen > 0.05 5/20 BAL PJP > neg 5/20 BAL respiratory viral panel >> negative 5/20 BAL SARS CoV 2 > Postive 5/20 Nasal SARS CoV 2 > positive 5/20 serum fungitel> negative  Antimicrobials:  5/17 zosyn x1 , 5/20  5/17 vanc x1, 5/20 > 5/19 Ivermectin > 5/20 Azithromycin 5/21 >> 5/23 Ceftriaxone  5/21>> Tocilizumab 5/21  Remdesivir 5/20 >> 5/24 Decadron 5/18 >> 5/29  Interim history/subjective:   Synchronous with the vent From 6/3 No overnight events  Objective   Blood pressure (!) 132/51, pulse 82, temperature 98.8 F (37.1 C), temperature source Axillary, resp. rate (!) 39, height _0  (1.651 m), weight 90.1 kg, SpO2 90 %. CVP:  [7 mmHg-11 mmHg] 7 mmHg  Vent Mode: PRVC FiO2 (%):  [90 %-100 %] 90 % Set Rate:  [35 bmp] 35 bmp Vt Set:  [370 mL] 370 mL PEEP:  [14 cmH20] 14 cmH20 Plateau Pressure:  [27 cmH20-32 cmH20] 27 cmH20   Intake/Output Summary (Last 24 hours) at 01/01/2020 0930 Last data filed at 01/01/2020 0900 Gross per 24 hour  Intake 2806.7 ml  Output 1315 ml  Net 1491.7 ml   Filed Weights   12/30/19 0417 12/31/19 0404 01/01/20 0500  Weight: 92.9 kg 90.7 kg 90.1 kg    Examination:  General: Comfortable on the vent HENT: Moist oral mucosa, endotracheal tube in place PULM: Clear anteriorly, no added sounds CV: S1-S2 appreciated, no murmur GI: BS+, soft, nontender Neuro: sedated on vent  Labs significant for sodium 152, BUN 31, platelets 58 ABG 7.3 6/84/55  Resolved Hospital Problem list   AKI, hyperkalemia, shock, hypernatremia Severe vent dyssynchrony 6/1 Narcotic tachyphylaxis  Assessment & Plan:  Acute hypoxemic respiratory failure due to Covid ARDS Continue mechanical ventilation per ARDS protocol Target TVol 6-8cc/kgIBW Target Plateau Pressure < 30cm H20 Target driving pressure less than 15 cm of water Target PaO2 55-65: titrate PEEP/FiO2 per  protocol As long as PaO2 to FiO2 ratio is less than 1:150 position in prone position for 16 hours a day Check CVP daily if CVL in place Target CVP less than 4, diurese as necessary Ventilator associated pneumonia prevention protocol Proned 6/2 Will not prone patient as oxygenation is not improved with proning  Type 2 diabetes -Continue SSI, tube feeding with Levemir  coverage  Hypertension -Continue Norvasc  AKI Urinary retention Alpha-blocker added Replete electrolytes Monitor output  DM2 with Hyperglycemia: Continue SSI, tube feeding coverage and levemir  Hx HTN:  norvasc to continue  Need for sedation for mechanical ventilation RASS target -3 to -4 Continue Dilaudid, clonazepam Continue midazolam  Thrombocytopenia:  Monitor for bleeding -Platelet count improving  Hypernatremia -Increase free water 300 every 4 -Sodium continues to increase, will give 500 cc D5 -Continue to monitor  Best practice:  Diet: Continue tube feeds Pain/Anxiety/Delirium protocol (if indicated): Dilaudid, Versed, Klonopin VAP protocol (if indicated): yes DVT prophylaxis: Subcu heparin every 8 GI prophylaxis: Pantoprazole for stress ulcer prophylaxis Glucose control: as above Mobility: bed rest Code Status: full Family Communication: Will update Disposition: remain in ICU  The patient is critically ill with multiple organ systems failure and requires high complexity decision making for assessment and support, frequent evaluation and titration of therapies, application of advanced monitoring technologies and extensive interpretation of multiple databases. Critical Care Time devoted to patient care services described in this note independent of APP/resident time (if applicable)  is 32 minutes.   Sherrilyn Rist MD [place] [facility] Personal pager: [phone] If unanswered, please page CCM On-call: [phone]

## 2020-01-01 NOTE — Progress Notes (Signed)
Patient's head and arms repositioned at this time without complications.  

## 2020-01-01 NOTE — Progress Notes (Signed)
Spoke with patient's spouse-Janice, she will discuss DNR with the children. Unable to make a decision about other goals of care, I did relate that the likelihood of survival from what is going on at present is very small. She says she may want to come in for a face-to-face meeting but could not give me a time line. She will let me know/let us know when she is able to.

## 2020-01-01 NOTE — Progress Notes (Signed)
Patient supinated without complications. ETT re-secured with commercial tube holder in the proper position.

## 2020-01-01 NOTE — Progress Notes (Signed)
eLink Physician-Brief Progress Note Patient Name: Bradley Chambers DOB: 02/27/1956 MRN: [id number]   Date of Service  01/01/2020  HPI/Events of Note  Request for am labs  eICU Interventions  CBC, BMP ordered     Intervention Category Minor Interventions: Clinical assessment - ordering diagnostic tests  Darl Pikes 01/01/2020, 4:13 AM

## 2020-01-02 ENCOUNTER — Inpatient Hospital Stay (HOSPITAL_COMMUNITY): Payer: 59

## 2020-01-02 LAB — GLUCOSE, CAPILLARY
Glucose-Capillary: 129 mg/dL — ABNORMAL HIGH (ref 70–99)
Glucose-Capillary: 140 mg/dL — ABNORMAL HIGH (ref 70–99)

## 2020-01-15 LAB — FUNGUS CULTURE WITH STAIN

## 2020-01-15 LAB — FUNGUS CULTURE RESULT

## 2020-01-15 LAB — FUNGAL ORGANISM REFLEX

## 2020-01-28 NOTE — Death Summary Note (Signed)
DEATH SUMMARY   Patient Details  Name: Bradley Chambers MRN: [id number] DOB: Jun 11, 1956  Admission/Discharge Information   Admit Date:  12/24/19  Date of Death:    Time of Death:    Length of Stay: 20-Nov-2022  Referring Physician: Tamsen Roers, PA   Reason(s) for Hospitalization  ARDS secondary to COVID  Diagnoses  Preliminary cause of death:  Secondary Diagnoses (including complications and co-morbidities):  Active Problems:   ARDS (adult respiratory distress syndrome) (HCC)   Acute on chronic respiratory failure with hypoxia and hypercapnia [facility])   Brief Hospital Course (including significant findings, care, treatment, and services provided and events leading to death)  Bradley Chambers is a 64 y.o. year old male who presented on 5/18 with flu-like illness.  His oxygen worsened and ultimately required intubation on 24-Dec-2019.  Despite steroids, remdesivir, actemra, lung protective ventilation, and proning, he continued to languish and ultimately died of hypoxemia on 2020-01-09.  Pertinent Labs and Studies  Significant Diagnostic Studies DG Chest 1 View  Result Date: 01-09-20 CLINICAL DATA:  Follow-up for COVID-19 infection and ARDS. EXAM: CHEST  1 VIEW COMPARISON:  12/31/2019 and earlier studies. FINDINGS: Small to moderate right pleural effusion is new or increased compared to the prior chest radiograph. Bilateral interstitial and airspace lung opacities are without significant change. Probable small left pleural effusion. No pneumothorax. Endotracheal tube, nasogastric tube and right internal jugular central venous line are stable. IMPRESSION: 1. New or increased right pleural effusion compared to the prior study. 2. No other change. Persistent interstitial and hazy airspace lung opacities consistent with multifocal pneumonia, ARDS or a combination. Probable small left pleural effusion. 3. Stable well-positioned support apparatus. Electronically Signed   By: Amie Portland M.D.   On:  2020-01-09 11:10   DG Chest 1 View  Result Date: 12/19/2019 CLINICAL DATA:  COVID-19 positive patient. EXAM: CHEST  1 VIEW COMPARISON:  Dec 18, 2019 FINDINGS: The ETT is in good position. The right IJ terminates at the caval atrial junction. The NG tube terminates below today's film. No pneumothorax. Focal opacity in the left mid lower lung is stable. Interstitial opacities diffusely on the right are slightly less prominent the interval. No other interval changes. IMPRESSION: 1. Support apparatus as above. 2. Opacity throughout the mid and lower left lung is stable. 3. Opacity throughout the right lung, less focal than on the left, is less conspicuous in the interval. Electronically Signed   By: Gerome Sam III M.D   On: 12/19/2019 12:32   DG Chest 1 View  Result Date: 12-24-19 CLINICAL DATA:  Central line placement EXAM: CHEST  1 VIEW COMPARISON:  12/16/2019 FINDINGS: New right IJ central line tip overlies the SVC. New endotracheal tube is approximately 5 cm above the carina. Enteric tube passes below the diaphragm with tip out of field of view. No pneumothorax. Persistent bilateral pulmonary opacities with slight worsening of lung aeration. No significant pleural effusion. Normal heart size. IMPRESSION: Lines and tubes as above.  No pneumothorax. Slight worsening of bilateral pulmonary opacities since 12/16/2019. Electronically Signed   By: Guadlupe Spanish M.D.   On: Dec 24, 2019 15:02   CT Angio Chest PE W and/or Wo Contrast  Result Date: 12/15/2019 CLINICAL DATA:  Shortness of breath.  COVID-19 positive EXAM: CT ANGIOGRAPHY CHEST WITH CONTRAST TECHNIQUE: Multidetector CT imaging of the chest was performed using the standard protocol during bolus administration of intravenous contrast. Multiplanar CT image reconstructions and MIPs were obtained to evaluate the vascular anatomy. CONTRAST:  75mL OMNIPAQUE IOHEXOL 300 MG/ML  SOLN COMPARISON:  Chest radiograph Dec 15, 2019 FINDINGS: Cardiovascular:  There is no demonstrable pulmonary embolus. There is no appreciable thoracic aortic aneurysm or dissection. There is slight calcification at the origin of the left common carotid artery. Other visualized great vessels appear unremarkable. There is slight aortic atherosclerosis. There are occasional foci of coronary artery calcification. There is no pericardial effusion or pericardial thickening. Mediastinum/Nodes: Visualized thyroid appears unremarkable. There are multiple subcentimeter mediastinal lymph nodes. There is a subcarinal lymph node measuring 1.3 x 1.3 cm. There is a lymph node in the superior left hilum measuring 1.0 x 1.0 cm. There is a small hiatal hernia. Lungs/Pleura: There is extensive airspace opacity throughout the lungs diffusely, with involvement to varying degrees in essentially all lobes in segments except for sparing of the apical segment of the right upper lobe. There is slight consolidation in the lower lobes. Most areas have a more ill-defined opacity. No pleural effusions evident. Upper Abdomen: There is a degree of reflux into the inferior vena cava and slight reflux into hepatic veins. Visualized upper abdominal structures otherwise appear unremarkable. Musculoskeletal: There is degenerative change in the thoracic spine. No blastic or lytic bone lesions. No evident chest wall lesions. Review of the MIP images confirms the above findings. IMPRESSION: 1. No demonstrable pulmonary embolus. No thoracic aortic aneurysm or dissection. There is aortic atherosclerosis as well as occasional foci of coronary artery calcification. 2. Widespread airspace opacity bilaterally consistent with widespread multifocal pneumonia. Given the clinical history, suspect atypical organism pneumonia. 3. Mild adenopathy, likely of reactive etiology given the extensive parenchymal lung abnormalities. 4.  Small hiatal hernia. 5. There is mild reflux into the inferior vena cava and hepatic veins, a finding that  potentially may indicate a degree of increased right heart pressure. Aortic Atherosclerosis (ICD10-I70.0). Electronically Signed   By: Bretta Bang III M.D.   On: 12/15/2019 13:49   DG Chest Port 1 View  Result Date: 12/31/2019 CLINICAL DATA:  Respiratory failure EXAM: PORTABLE CHEST 1 VIEW COMPARISON:  12/30/2019 FINDINGS: No significant change in AP portable examination with diffuse bilateral interstitial and heterogeneous airspace opacity. No new or focal airspace opacity. Support apparatus including endotracheal tube, esophagogastric tube, and right neck vascular catheter are unchanged. IMPRESSION: 1. No significant change in AP portable examination with diffuse bilateral interstitial and heterogeneous airspace opacity, consistent with multifocal infection, edema, and/or ARDS. 2. Unchanged support apparatus. Electronically Signed   By: Lauralyn Primes M.D.   On: 12/31/2019 07:58   DG Chest Port 1 View  Result Date: 12/30/2019 CLINICAL DATA:  Acute respiratory failure with hypoxemia EXAM: PORTABLE CHEST 1 VIEW COMPARISON:  Yesterday FINDINGS: Endotracheal tube with tip 2 cm above the carina. Right IJ line with tip at the upper right atrium. Enteric tube reaches the stomach. Bilateral interstitial and airspace opacity that is stable. No visible effusion or air leak. Normal heart size. IMPRESSION: Stable hardware positioning and bilateral infiltrates. Electronically Signed   By: Marnee Spring M.D.   On: 12/30/2019 08:48   DG Chest Port 1 View  Result Date: 12/29/2019 CLINICAL DATA:  Acute respiratory failure EXAM: PORTABLE CHEST 1 VIEW COMPARISON:  12/27/2019 FINDINGS: Endotracheal tube tip is 2.7 cm above the carina. Right IJ line with tip at the upper right atrium. Enteric tube tip at least reaches the stomach. Normal heart size. Bilateral pulmonary infiltrate. No visible effusion or pneumothorax. IMPRESSION: Unremarkable hardware positioning and stable bilateral infiltrates. Electronically Signed    By: Marja Kays  Watts M.D.   On: 12/29/2019 05:37   DG Chest Port 1 View  Result Date: 12/27/2019 CLINICAL DATA:  COVID-19 pneumonia, respiratory failure EXAM: PORTABLE CHEST 1 VIEW COMPARISON:  12/24/2019 chest radiograph. FINDINGS: Endotracheal tube tip is 2.6 cm above the carina. Enteric tube enters stomach with the tip not seen on this image. Right internal jugular central venous catheter terminates at the cavoatrial junction. Stable cardiomediastinal silhouette with normal heart size. No pneumothorax. No significant pleural effusion. Extensive patchy opacities throughout both lungs, not substantially changed. IMPRESSION: 1. Stable extensive patchy opacities throughout both lungs, compatible with COVID-19 pneumonia. 2. Well-positioned support structures structures. Electronically Signed   By: Delbert PhenixJason A Poff M.D.   On: 12/27/2019 05:29   DG CHEST PORT 1 VIEW  Result Date: 12/24/2019 CLINICAL DATA:  Acute hypoxic respiratory failure EXAM: PORTABLE CHEST 1 VIEW COMPARISON:  Radiograph 12/23/2019 FINDINGS: Endotracheal tube 4.2 cm from the carina. Transesophageal tube tip is beyond margins of imaging, below the GE junction. Right IJ approach central venous catheter tip terminates at the level of the right atrium. Telemetry leads overlie the chest. Persistent heterogeneous bilateral interstitial and airspace disease with some slightly improved opacity in the right lung periphery. More dense, confluent basilar opacities remain. Cardiomediastinal contours are unchanged. No acute osseous or soft tissue abnormality. IMPRESSION: 1. Persistent heterogeneous bilateral interstitial and airspace disease with some slightly improved opacity in the right lung periphery. 2. Lines and tubes as above. Electronically Signed   By: Kreg ShropshirePrice  DeHay M.D.   On: 12/24/2019 06:33   DG CHEST PORT 1 VIEW  Result Date: 12/23/2019 CLINICAL DATA:  Acute hypoxemic respiratory failure EXAM: PORTABLE CHEST 1 VIEW COMPARISON:  Two days ago  FINDINGS: The endotracheal tube tip is between the clavicular heads and carina. The enteric tube reaches the stomach and the right IJ line is in good position. Extensive interstitial and airspace disease with increasing density. No visible effusion or air leak. Stable normal heart size IMPRESSION: 1. Stable hardware positioning. 2. Multifocal pneumonia with increasing density of the opacities. Electronically Signed   By: Marnee SpringJonathon  Watts M.D.   On: 12/23/2019 07:45   DG Chest Port 1 View  Result Date: 12/21/2019 CLINICAL DATA:  Acute on chronic respiratory failure with hypoxia and hypercapnia EXAM: PORTABLE CHEST 1 VIEW COMPARISON:  Radiograph 12/20/2019, CT 12/15/2019 FINDINGS: *Endotracheal tube in the mid trachea, 3 cm from the carina. *Transesophageal tube tip below the GE junction, beyond the margins of imaging. *Right IJ approach central venous catheter tip terminates at the right atrium. *Telemetry leads and additional support devices overlie the chest. Multifocal areas of mixed interstitial and consolidative opacity throughout both lungs are stable from prior exam compatible with patient's known COVID-19 pneumonia. No pneumothorax or visible effusion. Stable cardiomediastinal contours. No acute osseous or soft tissue abnormality. Degenerative changes are present in the imaged spine and shoulders. IMPRESSION: Stable areas of mixed interstitial and consolidative opacity throughout both lungs compatible with patient's known multifocal COVID-19 pneumonia. Lines and tubes as above. Electronically Signed   By: Kreg ShropshirePrice  DeHay M.D.   On: 12/21/2019 06:26   DG Chest Port 1 View  Result Date: 12/20/2019 CLINICAL DATA:  COVID positive, acute on chronic respiratory failure EXAM: PORTABLE CHEST 1 VIEW COMPARISON:  12/19/2019 FINDINGS: Endotracheal tube terminates 3.7 cm above the carina. Enteric tube courses below the diaphragm. Right IJ venous catheter terminates 3 cm below the carina. Multifocal patchy opacities,  lower lobe predominant, mildly progressive in this patient with multifocal pneumonia related to known  COVID. No pleural effusion or pneumothorax. The heart is normal in size. IMPRESSION: Endotracheal tube terminates 3.7 cm above the carina. Additional support apparatus as above. Multifocal pneumonia in this patient with known COVID, mildly progressive. Electronically Signed   By: Charline Bills M.D.   On: 12/20/2019 07:34   DG Chest Port 1 View  Result Date: 12/18/2019 CLINICAL DATA:  Acute respiratory failure. EXAM: PORTABLE CHEST 1 VIEW COMPARISON:  12-21-19 FINDINGS: The endotracheal tube, NG tube and right IJ catheters are stable. Persistent interstitial and airspace process in the lungs. No pneumothorax or pleural effusion. IMPRESSION: 1. Stable support apparatus. 2. Persistent bilateral lung infiltrates. Electronically Signed   By: Rudie Meyer M.D.   On: 12/18/2019 07:00   DG Chest Port 1 View  Result Date: 12/16/2019 CLINICAL DATA:  COVID positive EXAM: PORTABLE CHEST 1 VIEW COMPARISON:  Yesterday FINDINGS: Patchy bilateral pulmonary infiltrate that is unchanged. No pleural effusion or pneumothorax. Cardiomegaly that is chronic. No pneumothorax. IMPRESSION: Stable multifocal pneumonia. Electronically Signed   By: Marnee Spring M.D.   On: 12/16/2019 05:52   DG Chest Port 1 View  Result Date: 12/15/2019 CLINICAL DATA:  Dyspnea. EXAM: PORTABLE CHEST 1 VIEW COMPARISON:  Report dated 05/25/2011. FINDINGS: Enlarged cardiac silhouette. Marked patchy airspace opacity in both lungs with some sparing at the lung bases and left lung apex. Minimal bilateral pleural effusions. Thoracic spine degenerative changes and mild scoliosis. IMPRESSION: 1. Marked bilateral pneumonia or alveolar edema. 2. Minimal bilateral pleural effusions. 3. Cardiomegaly. Electronically Signed   By: Beckie Salts M.D.   On: 12/15/2019 11:23   ECHOCARDIOGRAM COMPLETE  Result Date: 12/16/2019    ECHOCARDIOGRAM REPORT    Patient Name:   ZANE PELLECCHIA Date of Exam: 12/16/2019 Medical Rec #:  [id number]     Height:       65.0 in Accession #:    [id number]    Weight:       205.2 lb Date of Birth:  04-05-1956    BSA:          2.000 m Patient Age:    63 years      BP:           111/71 mmHg Patient Gender: M             HR:           61 bpm. Exam Location:  [facility] Procedure: 2D Echo, Color Doppler and Cardiac Doppler Indications:     I50.31 CHF-Acute Diastolic  History:         Patient has no prior history of Echocardiogram examinations. Pt                  tested positive for COVID-19 on 12/15/2019.  Sonographer:     Humphrey Rolls RDCS (AE) Referring Phys:  [id number] Vida Rigger Diagnosing Phys: Debbe Odea MD  Sonographer Comments: Suboptimal parasternal window. Image acquisition challenging due to patient body habitus. IMPRESSIONS  1. Left ventricular ejection fraction, by estimation, is 55 to 60%. The left ventricle has normal function. The left ventricle has no regional wall motion abnormalities. Left ventricular diastolic parameters were normal.  2. Right ventricular systolic function is normal. The right ventricular size is mildly enlarged. There is moderately elevated pulmonary artery systolic pressure.  3. Left atrial size was mildly dilated.  4. The mitral valve is normal in structure. No evidence of mitral valve regurgitation.  5. The aortic valve is grossly normal. Aortic valve regurgitation is not visualized.  6. The inferior vena cava is normal in size with greater than 50% respiratory variability, suggesting right atrial pressure of 3 mmHg. FINDINGS  Left Ventricle: Left ventricular ejection fraction, by estimation, is 55 to 60%. The left ventricle has normal function. The left ventricle has no regional wall motion abnormalities. The left ventricular internal cavity size was normal in size. There is  no left ventricular hypertrophy. Left ventricular diastolic parameters were normal. Right Ventricle: The right ventricular  size is mildly enlarged. No increase in right ventricular wall thickness. Right ventricular systolic function is normal. There is moderately elevated pulmonary artery systolic pressure. The tricuspid regurgitant velocity is 3.27 m/s, and with an assumed right atrial pressure of 3 mmHg, the estimated right ventricular systolic pressure is 69.6 mmHg. Left Atrium: Left atrial size was mildly dilated. Right Atrium: Right atrial size was normal in size. Pericardium: There is no evidence of pericardial effusion. Mitral Valve: The mitral valve is normal in structure. No evidence of mitral valve regurgitation. MV peak gradient, 2.7 mmHg. The mean mitral valve gradient is 1.0 mmHg. Tricuspid Valve: The tricuspid valve is grossly normal. Tricuspid valve regurgitation is not demonstrated. Aortic Valve: The aortic valve is grossly normal. Aortic valve regurgitation is not visualized. Aortic valve mean gradient measures 4.0 mmHg. Aortic valve peak gradient measures 8.5 mmHg. Aortic valve area, by VTI measures 2.50 cm. Pulmonic Valve: The pulmonic valve was not well visualized. Pulmonic valve regurgitation is not visualized. Aorta: The aortic root is normal in size and structure. Venous: The inferior vena cava is normal in size with greater than 50% respiratory variability, suggesting right atrial pressure of 3 mmHg. IAS/Shunts: No atrial level shunt detected by color flow Doppler.  LEFT VENTRICLE PLAX 2D LVIDd:         4.32 cm  Diastology LVIDs:         2.86 cm  LV e' lateral:   6.96 cm/s LV PW:         1.14 cm  LV E/e' lateral: 10.0 LV IVS:        0.82 cm  LV e' medial:    6.74 cm/s LVOT diam:     2.10 cm  LV E/e' medial:  10.4 LV SV:         66 LV SV Index:   33 LVOT Area:     3.46 cm  RIGHT VENTRICLE RV Basal diam:  4.73 cm LEFT ATRIUM             Index       RIGHT ATRIUM           Index LA diam:        3.00 cm 1.50 cm/m  RA Area:     21.80 cm LA Vol (A2C):   46.4 ml 23.20 ml/m RA Volume:   75.90 ml  37.95 ml/m LA Vol  (A4C):   34.1 ml 17.05 ml/m LA Biplane Vol: 41.8 ml 20.90 ml/m  AORTIC VALVE                   PULMONIC VALVE AV Area (Vmax):    2.35 cm    PV Vmax:       1.13 m/s AV Area (Vmean):   2.36 cm    PV Vmean:      62.800 cm/s AV Area (VTI):     2.50 cm    PV VTI:        0.163 m AV Vmax:  146.00 cm/s PV Peak grad:  5.1 mmHg AV Vmean:          92.000 cm/s PV Mean grad:  2.0 mmHg AV VTI:            0.265 m AV Peak Grad:      8.5 mmHg AV Mean Grad:      4.0 mmHg LVOT Vmax:         99.20 cm/s LVOT Vmean:        62.700 cm/s LVOT VTI:          0.191 m LVOT/AV VTI ratio: 0.72  AORTA Ao Root diam: 3.20 cm MITRAL VALVE               TRICUSPID VALVE MV Area (PHT): 3.20 cm    TR Peak grad:   42.8 mmHg MV Peak grad:  2.7 mmHg    TR Vmax:        327.00 cm/s MV Mean grad:  1.0 mmHg MV Vmax:       0.82 m/s    SHUNTS MV Vmean:      47.2 cm/s   Systemic VTI:  0.19 m MV Decel Time: 237 msec    Systemic Diam: 2.10 cm MV E velocity: 69.80 cm/s MV A velocity: 72.80 cm/s MV E/A ratio:  0.96 Debbe Odea MD Electronically signed by Debbe Odea MD Signature Date/Time: 12/16/2019/12:30:40 PM    Final     Microbiology No results found for this or any previous visit (from the past 240 hour(s)).  Lab Basic Metabolic Panel: Recent Labs  Lab 12/27/19 0225 12/28/19 0419 12/29/19 0440 12/29/19 0440 12/30/19 0449 12/30/19 0831 12/31/19 6295 12/31/19 0402 12/31/19 1208 01/01/20 0359 01/01/20 0503  NA 139   < > 139   < > 142   < > 149* 147* 149* 150* 152*  K 4.6   < > 4.6   < > 4.9   < > 5.0 4.9 5.0 5.0 5.0  CL 91*  --  93*  --  97*  --   --  103  --   --  104  CO2 39*  --  35*  --  38*  --   --  41*  --   --  43*  GLUCOSE 181*  --  203*  --  203*  --   --  208*  --   --  114*  BUN 45*  --  92*  --  105*  --   --  55*  --   --  31*  CREATININE 0.89  --  1.39*  --  1.40*  --   --  0.77  --   --  0.61  CALCIUM 8.3*  --  7.9*  --  8.1*  --   --  8.4*  --   --  8.9  MG  --   --  2.9*  --   --   --   --   --    --   --   --   PHOS  --   --  8.2*  --   --   --   --   --   --   --   --    < > = values in this interval not displayed.   Liver Function Tests: Recent Labs  Lab 12/30/19 0449 12/31/19 0402  AST 25 32  ALT 63* 60*  ALKPHOS 53 55  BILITOT 0.9 0.8  PROT 5.0* 5.0*  ALBUMIN 2.4*  2.4*   No results for input(s): LIPASE, AMYLASE in the last 168 hours. No results for input(s): AMMONIA in the last 168 hours. CBC: Recent Labs  Lab 12/27/19 0225 12/28/19 0419 12/29/19 0440 12/29/19 0440 12/30/19 0449 12/30/19 0831 12/31/19 0337 12/31/19 0402 12/31/19 1208 01/01/20 0359 01/01/20 0503  WBC 12.4*  --  9.6  --  6.0  --   --  4.8  --   --  4.0  NEUTROABS  --   --   --   --  5.2  --   --  3.9  --   --   --   HGB 11.9*   < > 11.3*   < > 10.3*   < > 10.2* 10.6* 9.9* 9.9* 10.1*  HCT 37.8*   < > 35.5*   < > 33.9*   < > 30.0* 34.7* 29.0* 29.0* 34.9*  MCV 99.7  --  100.0  --  103.4*  --   --  106.4*  --   --  108.0*  PLT 106*  --  77*  --  60*  --   --  56*  --   --  58*   < > = values in this interval not displayed.   Cardiac Enzymes: No results for input(s): CKTOTAL, CKMB, CKMBINDEX, TROPONINI in the last 168 hours. Sepsis Labs: Recent Labs  Lab 12/29/19 0440 12/30/19 0449 12/31/19 0402 01/01/20 0503  WBC 9.6 6.0 4.8 4.0    Procedures/Operations  5/18 came to [facility] ED, admitted to ICU off/on BIPAP, treated with remdesivir, decadron, ivermectin 5/20 intubated for severe acute respiratory failure with hypoxemia and tx to [facility], s/p Bronch 5/22 Started paralytics 5/23 Off pressors 5/31Dysynchronouson vent. Agitated when sedation is weaned 6/2   Proning resumed 6/4 he was proned on 6/3, no significant benefit to his oxygenation  Lorin Glass 01-24-2020, 12:08 PM

## 2020-01-28 NOTE — Progress Notes (Signed)
NAME:  Bradley Chambers, MRN:  [id number], DOB:  Feb 20, 1956, LOS: 37 ADMISSION DATE:  12/11/2019, CONSULTATION DATE:  5/20 REFERRING MD:  Lanney Gins, CHIEF COMPLAINT:  dyspnea   Brief History   64 y/o male admitted for severe acute respiratory failure with hypoxemia on 5/18 to [facility], required intubation on 5/20 and tranfer to [facility] COVID+   Past Medical History  Hypertension Hyperlipidemia Obesity  Significant Hospital Events   5/18 came to [facility] ED, admitted to ICU off/on BIPAP, treated with remdesivir, decadron, ivermectin 5/20 intubated for severe acute respiratory failure with hypoxemia and tx to [facility], s/p Bronch 5/22 Started paralytics 5/23 Off pressors 5/31 Dysynchronous on vent. Agitated when sedation is weaned 6/2   Proning resumed 6/4 he was proned on 6/3, no significant benefit to his oxygenation  Consults:  PCCM  Procedures:  5/20 ETT >  5/20 R IJ CVL >   Significant Diagnostic Tests:  5/18 CT angiogram chest > bilateral upper lobe predominant ground glass opacification and some patchy lower lobe consolidation, reactive lymphadenopathy, no PE  5/19 TTE> LVEF 55-60%, LV normal size/function, RV systolic function normal, RV size enlarged, moderately elevated PA pressure, left atrium mildly dilated, no mitral regurgitation  5/20 > Bronchoscopy  Micro Data:  5/19 SARS CoV2 Ag > negative 5/19 SARS CoV2 PCR > negative 5/19 SARS CoV2 Ab > negative 5/18 Blood > negative 5/18 blood > negative 5/18 CMV IgM > negative 5/18 EBV IgG > elevated 5/18 EBV IgM > negative 5/18 HIV > negative 5/20 BAL bacterial culture > neg 5/20 BAL fungal culture > negative 5/20 BAL aspergillus antigen > 0.05 5/20 BAL PJP > neg 5/20 BAL respiratory viral panel >> negative 5/20 BAL SARS CoV 2 > Postive 5/20 Nasal SARS CoV 2 > positive 5/20 serum fungitel> negative  Antimicrobials:  5/17 zosyn x1 , 5/20  5/17 vanc x1, 5/20 > 5/19 Ivermectin > 5/20 Azithromycin 5/21 >> 5/23 Ceftriaxone  5/21>> Tocilizumab 5/21  Remdesivir 5/20 >> 5/24 Decadron 5/18 >> 5/29  Interim history/subjective:  Worsening oxygenation through night and morning. Family on way in.  Objective   Blood pressure (!) 102/44, pulse 99, temperature 98.6 F (37 C), temperature source Oral, resp. rate (!) 35, height _0  (1.651 m), weight 90 kg, SpO2 (!) 63 %. CVP:  [9 mmHg-15 mmHg] 15 mmHg  Vent Mode: PRVC FiO2 (%):  [100 %] 100 % Set Rate:  [35 bmp] 35 bmp Vt Set:  [370 mL] 370 mL PEEP:  [14 cmH20] 14 cmH20 Plateau Pressure:  [26 cmH20-34 cmH20] 34 cmH20   Intake/Output Summary (Last 24 hours) at 28-Jan-2020 1202 Last data filed at 01-28-20 0800 Gross per 24 hour  Intake 1797.58 ml  Output 1265 ml  Net 532.58 ml   Filed Weights   12/31/19 0404 01/01/20 0500 2020/01/28 0500  Weight: 90.7 kg 90.1 kg 90 kg    Examination: GEN: ill appearing man on vent HEENT: ETT in place minimal secretions CV: RRR, ext warm PULM: severely diminished air movement GI: Soft, hypoactive BS EXT: Trace anasarca NEURO: paralyzed on vent PSYCH: cannot assess SKIN: no rashes  CXR continued severe bilateral airspace disease  Resolved Hospital Problem list   AKI, hyperkalemia, shock, hypernatremia Severe vent dyssynchrony 6/1 Narcotic tachyphylaxis  Assessment & Plan:  Acute hypoxemic respiratory failure due to Covid ARDS Progressive deterioration this AM.  Has been in hospital on vent over 2 weeks. Proning has no benefit. He is actively dying. Wife on his way in. We agreed on DNR,  do not expect him to live day.  32 minutes CC time Erskine Emery MD PCCM

## 2020-01-28 NOTE — Progress Notes (Signed)
Patient passed away peacefully.  Kim RN and myself pronounced death at 109.  RNs were at bedside while patient was passing

## 2020-01-28 NOTE — Progress Notes (Signed)
Wasted 75 of hydromorphone and 40 of midazolam with [place] as a witness.

## 2020-01-28 NOTE — Progress Notes (Signed)
Spoke with wife about further deterioration. She does not think she can come in today. We agreed if his heart stops we let him pass peacefully as CPR would not change outcome here. She will try to get in touch with son.  Myrla Halsted MD PCCM

## 2020-01-28 DEATH — deceased

## 2022-05-09 IMAGING — DX DG CHEST 1V PORT
1 series · 1 of 1 positions shown · non-contrast
Comparison: Yesterday

CLINICAL DATA: Acute respiratory failure with hypoxemia

EXAM:
PORTABLE CHEST 1 VIEW

[chest ap]
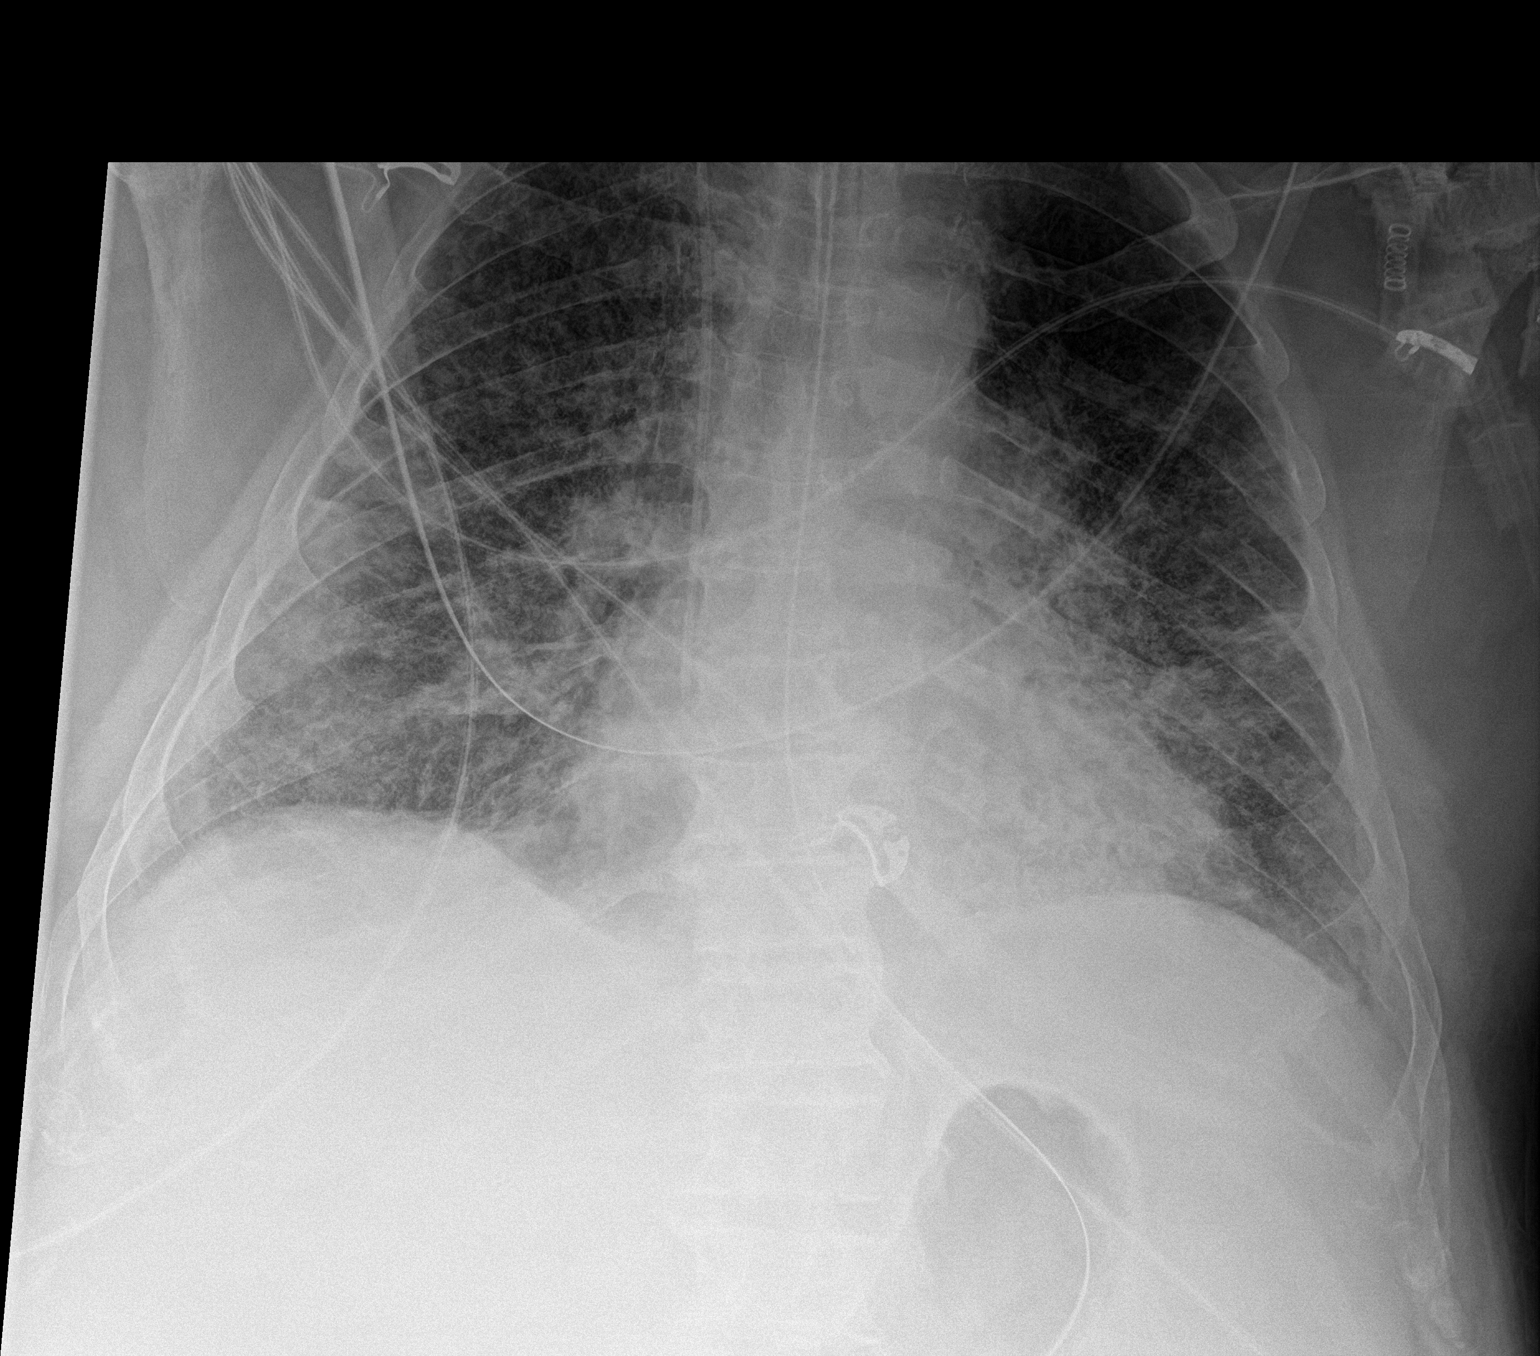

[1 of 1 positions shown; findings below may reference images not displayed]

FINDINGS: Endotracheal tube with tip 2 cm above the carina. Right IJ line with
tip at the upper right atrium. Enteric tube reaches the stomach.

Bilateral interstitial and airspace opacity that is stable. No
visible effusion or air leak. Normal heart size.
IMPRESSION: Stable hardware positioning and bilateral infiltrates.

## 2022-05-10 IMAGING — DX DG CHEST 1V PORT
1 series · 1 of 1 positions shown · non-contrast
Comparison: 12/30/2019

CLINICAL DATA: Respiratory failure

EXAM:
PORTABLE CHEST 1 VIEW

[chest ap]
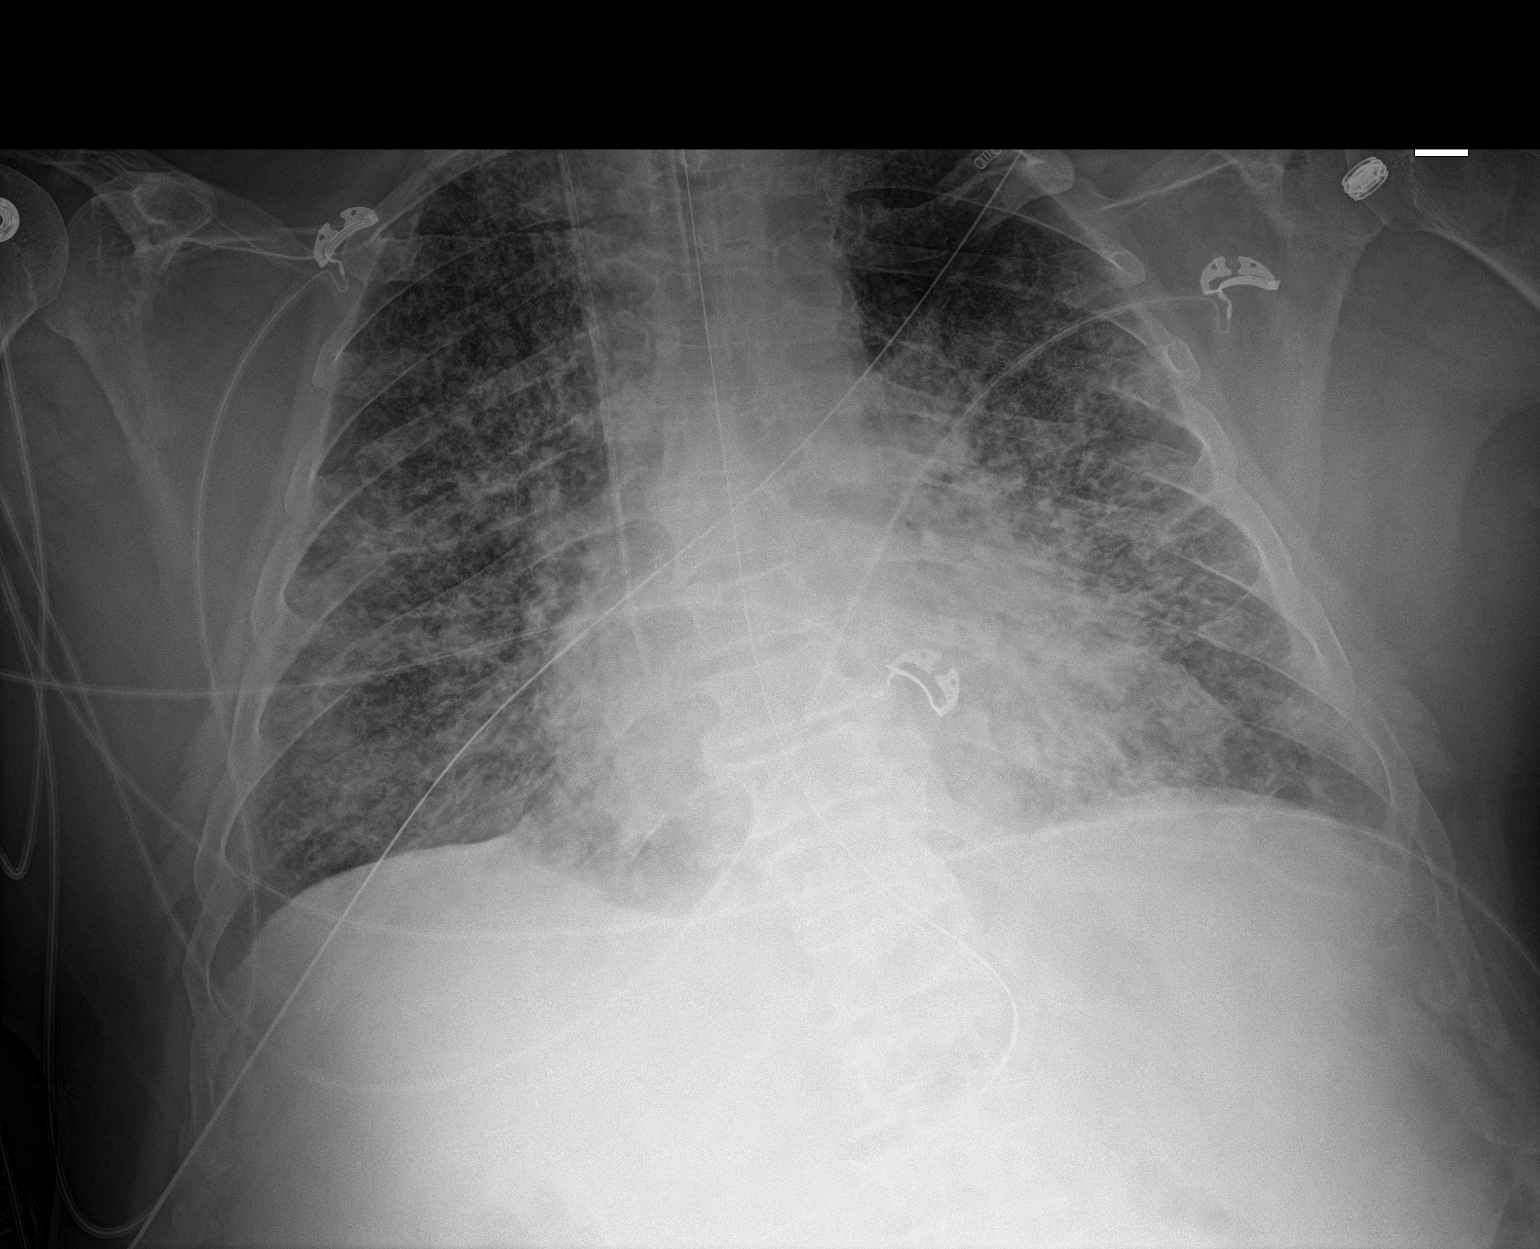

[1 of 1 positions shown; findings below may reference images not displayed]

FINDINGS: No significant change in AP portable examination with diffuse
bilateral interstitial and heterogeneous airspace opacity. No new or
focal airspace opacity. Support apparatus including endotracheal
tube, esophagogastric tube, and right neck vascular catheter are
unchanged.
IMPRESSION: 1. No significant change in AP portable examination with diffuse
bilateral interstitial and heterogeneous airspace opacity,
consistent with multifocal infection, edema, and/or ARDS. 2.
Unchanged support apparatus.
